# Patient Record
Sex: Male | Born: 1955 | Race: White | Hispanic: No | Marital: Married | Smoking: Never smoker
Health system: Southern US, Community
[De-identification: ages and names within clinical notes are randomized; demographics above are authoritative.]

## PROBLEM LIST (undated history)

## (undated) DIAGNOSIS — G459 Transient cerebral ischemic attack, unspecified: Secondary | ICD-10-CM

## (undated) DIAGNOSIS — E7800 Pure hypercholesterolemia, unspecified: Secondary | ICD-10-CM

## (undated) DIAGNOSIS — I10 Essential (primary) hypertension: Secondary | ICD-10-CM

## (undated) DIAGNOSIS — G4733 Obstructive sleep apnea (adult) (pediatric): Principal | ICD-10-CM

## (undated) DIAGNOSIS — E079 Disorder of thyroid, unspecified: Secondary | ICD-10-CM

## (undated) DIAGNOSIS — Z9989 Dependence on other enabling machines and devices: Principal | ICD-10-CM

## (undated) HISTORY — DX: Obstructive sleep apnea (adult) (pediatric): G47.33

## (undated) HISTORY — DX: Disorder of thyroid, unspecified: E07.9

## (undated) HISTORY — PX: OTHER SURGICAL HISTORY: SHX169

## (undated) HISTORY — PX: VASECTOMY: SHX75

## (undated) HISTORY — DX: Dependence on other enabling machines and devices: Z99.89

---

## 2009-10-23 ENCOUNTER — Encounter (INDEPENDENT_AMBULATORY_CARE_PROVIDER_SITE_OTHER): Payer: Self-pay | Admitting: Neurology

## 2009-10-23 ENCOUNTER — Observation Stay (HOSPITAL_COMMUNITY)
Admission: EM | Admit: 2009-10-23 | Discharge: 2009-10-23 | Payer: Self-pay | Source: Home / Self Care | Admitting: Emergency Medicine

## 2010-03-20 LAB — URINALYSIS, ROUTINE W REFLEX MICROSCOPIC
Hgb urine dipstick: NEGATIVE
Protein, ur: NEGATIVE mg/dL
Urobilinogen, UA: 0.2 mg/dL (ref 0.0–1.0)

## 2010-03-20 LAB — CBC
HCT: 48.1 % (ref 39.0–52.0)
Platelets: 208 10*3/uL (ref 150–400)
RBC: 5.7 MIL/uL (ref 4.22–5.81)
RDW: 13.2 % (ref 11.5–15.5)
WBC: 5.5 10*3/uL (ref 4.0–10.5)

## 2010-03-20 LAB — DIFFERENTIAL
Lymphocytes Relative: 33 % (ref 12–46)
Lymphs Abs: 1.8 10*3/uL (ref 0.7–4.0)
Monocytes Absolute: 0.5 10*3/uL (ref 0.1–1.0)
Monocytes Relative: 9 % (ref 3–12)
Neutro Abs: 2.9 10*3/uL (ref 1.7–7.7)

## 2010-03-20 LAB — COMPREHENSIVE METABOLIC PANEL
AST: 24 U/L (ref 0–37)
Albumin: 4.2 g/dL (ref 3.5–5.2)
BUN: 16 mg/dL (ref 6–23)
Creatinine, Ser: 1.26 mg/dL (ref 0.4–1.5)
GFR calc Af Amer: >60 mL/min (ref >60)
Total Protein: 7.6 g/dL (ref 6.0–8.3)

## 2010-03-20 LAB — CK TOTAL AND CKMB (NOT AT ARMC)
CK, MB: 3.4 ng/mL (ref 0.3–4.0)
Relative Index: 2 (ref 0.0–2.5)

## 2010-03-20 LAB — LIPID PANEL
Cholesterol: 245 mg/dL — ABNORMAL HIGH (ref 0–200)
HDL: 37 mg/dL — ABNORMAL LOW (ref >39)
Total CHOL/HDL Ratio: 6.6 RATIO
VLDL: 31 mg/dL (ref 0–40)

## 2010-03-20 LAB — PROTIME-INR: INR: 0.98 (ref 0.00–1.49)

## 2010-03-20 LAB — GLUCOSE, CAPILLARY: Glucose-Capillary: 121 mg/dL — ABNORMAL HIGH (ref 70–99)

## 2010-03-20 LAB — APTT: aPTT: 35 seconds (ref 24–37)

## 2010-03-20 LAB — TROPONIN I: Troponin I: <0.01 ng/mL (ref 0.00–0.06)

## 2012-10-11 ENCOUNTER — Emergency Department (HOSPITAL_COMMUNITY)
Admission: EM | Admit: 2012-10-11 | Discharge: 2012-10-11 | Disposition: A | Discharge status: Home / Self Care | Payer: PRIVATE HEALTH INSURANCE | Attending: Emergency Medicine | Admitting: Emergency Medicine

## 2012-10-11 ENCOUNTER — Emergency Department (HOSPITAL_COMMUNITY): Payer: PRIVATE HEALTH INSURANCE

## 2012-10-11 ENCOUNTER — Encounter (HOSPITAL_COMMUNITY): Payer: Self-pay | Admitting: Emergency Medicine

## 2012-10-11 DIAGNOSIS — Z7902 Long term (current) use of antithrombotics/antiplatelets: Secondary | ICD-10-CM | POA: Insufficient documentation

## 2012-10-11 DIAGNOSIS — Z9981 Dependence on supplemental oxygen: Secondary | ICD-10-CM | POA: Insufficient documentation

## 2012-10-11 DIAGNOSIS — Z79899 Other long term (current) drug therapy: Secondary | ICD-10-CM | POA: Insufficient documentation

## 2012-10-11 DIAGNOSIS — R0789 Other chest pain: Secondary | ICD-10-CM | POA: Insufficient documentation

## 2012-10-11 DIAGNOSIS — R61 Generalized hyperhidrosis: Secondary | ICD-10-CM | POA: Insufficient documentation

## 2012-10-11 DIAGNOSIS — Z7982 Long term (current) use of aspirin: Secondary | ICD-10-CM | POA: Insufficient documentation

## 2012-10-11 HISTORY — DX: Essential (primary) hypertension: I10

## 2012-10-11 HISTORY — DX: Pure hypercholesterolemia, unspecified: E78.00

## 2012-10-11 HISTORY — DX: Transient cerebral ischemic attack, unspecified: G45.9

## 2012-10-11 LAB — POCT I-STAT, CHEM 8
BUN: 21 mg/dL (ref 6–23)
Creatinine, Ser: 1.2 mg/dL (ref 0.50–1.35)
Glucose, Bld: 123 mg/dL — ABNORMAL HIGH (ref 70–99)
Hemoglobin: 16 g/dL (ref 13.0–17.0)
Sodium: 139 mEq/L (ref 135–145)
TCO2: 26 mmol/L (ref 0–100)

## 2012-10-11 LAB — CBC WITH DIFFERENTIAL/PLATELET
Eosinophils Absolute: 0.2 10*3/uL (ref 0.0–0.7)
Eosinophils Relative: 4 % (ref 0–5)
HCT: 44.7 % (ref 39.0–52.0)
Hemoglobin: 16 g/dL (ref 13.0–17.0)
Lymphocytes Relative: 28 % (ref 12–46)
Lymphs Abs: 1.4 10*3/uL (ref 0.7–4.0)
MCH: 30.1 pg (ref 26.0–34.0)
MCV: 84.2 fL (ref 78.0–100.0)
Monocytes Absolute: 0.5 10*3/uL (ref 0.1–1.0)
Monocytes Relative: 11 % (ref 3–12)
RBC: 5.31 MIL/uL (ref 4.22–5.81)
WBC: 5.1 10*3/uL (ref 4.0–10.5)

## 2012-10-11 LAB — POCT I-STAT TROPONIN I

## 2012-10-11 MED ORDER — NITROGLYCERIN 0.4 MG SL SUBL
0.4000 mg | SUBLINGUAL_TABLET | SUBLINGUAL | Status: DC | PRN
  Administered 2012-10-11 (×2): 0.4 mg via SUBLINGUAL
  Filled 2012-10-11: qty 25

## 2012-10-11 NOTE — ED Notes (Signed)
Pt states that he was having intense chest discomfort this AM when he woke. Pt states that the severity of the discomfort has lessened. Pt states that he has taken 81 mg of aspirin this AM.

## 2012-10-11 NOTE — ED Provider Notes (Signed)
CSN: [id number]     Arrival date & time 10/11/12  0436 History   First MD Initiated Contact with Patient 10/11/12 [phone]     Chief Complaint  Patient presents with  . Chest Pain   (Consider location/radiation/quality/duration/timing/severity/associated sxs/prior Treatment) HPI This is a 57 year old male who developed chest pain this morning about 3:30. The chest pain is located in his left pectoral region and is described as vague and difficult to characterize. It does not radiate. It was of mild to moderate intensity initially and is very mild at this point. There was no associated shortness of breath or nausea. He has been doing a lot of lifting recently. He did have some sweating but states that he usually sweats at night due to his CPAP machine. He is on aspirin and Plavix daily as a result of a TIA several years ago. He took an extra dose of aspirin this morning prior to arrival.  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  No past medical history on file. No past surgical history on file. No family history on file. History  Substance Use Topics  . Smoking status: Not on file  . Smokeless tobacco: Not on file  . Alcohol Use: Not on file    Review of Systems  All other systems reviewed and are negative.    Allergies  Review of patient's allergies indicates not on file.  Home Medications   Current Outpatient Rx  Name  Route  Sig  Dispense  Refill  . aspirin 81 MG tablet   Oral   Take 81 mg by mouth daily.         . Cholecalciferol (VITAMIN D) 2000 UNITS tablet   Oral   Take 2,000 Units by mouth daily.         . clopidogrel (PLAVIX) 75 MG tablet   Oral   Take 75 mg by mouth daily.         Marland Kitchen ezetimibe (ZETIA) 10 MG tablet   Oral   Take 10 mg by mouth daily.         . folic acid (FOLVITE) 800 MCG tablet   Oral   Take 800 mcg by mouth daily.         Marland Kitchen levothyroxine (SYNTHROID, LEVOTHROID) 125 MCG tablet  Oral   Take 125 mcg by mouth daily before breakfast.         . olmesartan (BENICAR) 20 MG tablet   Oral   Take 20 mg by mouth daily.         . rosuvastatin (CRESTOR) 10 MG tablet   Oral   Take 10 mg by mouth daily.          BP 158/99  Pulse 83  Temp(Src) 98.1 F (36.7 C) (Oral)  Resp 16  SpO2 99%  Physical Exam General: Well-developed, well-nourished male in no acute distress; appearance consistent with age of record HENT: normocephalic; atraumatic Eyes: pupils equal, round and reactive to light; extraocular muscles intact Neck: supple Heart: regular rate and rhythm; no murmurs, rubs or gallops Lungs: clear to auscultation bilaterally Chest: Left lateral chest wall tenderness which reproduces pain of a similar character to the pain of the chief complaint Abdomen: soft; nondistended; nontender; bowel sounds present Extremities: No deformity; full range of motion; pulses normal Neurologic: Awake, alert and oriented; motor function intact in all extremities and symmetric; no facial droop Skin: Warm and dry Psychiatric: Normal mood and affect  ED Course  Procedures (including critical care time)  MDM   Nursing notes and vitals signs, including pulse oximetry, reviewed.  Summary of this visit's results, reviewed by myself:  Labs:  Results for orders placed during the hospital encounter of 10/11/12 (from the past 24 hour(s))  CBC WITH DIFFERENTIAL     Status: None   Collection Time    10/11/12  4:54 AM      Result Value Range   WBC 5.1  4.0 - 10.5 K/uL   RBC 5.31  4.22 - 5.81 MIL/uL   Hemoglobin 16.0  13.0 - 17.0 g/dL   HCT 65.7  84.6 - 96.2 %   MCV 84.2  78.0 - 100.0 fL   MCH 30.1  26.0 - 34.0 pg   MCHC 35.8  30.0 - 36.0 g/dL   RDW 95.2  84.1 - 32.4 %   Platelets 156  150 - 400 K/uL   Neutrophils Relative % 57  43 - 77 %   Neutro Abs 2.9  1.7 - 7.7 K/uL   Lymphocytes Relative 28  12 - 46 %   Lymphs Abs 1.4  0.7 - 4.0 K/uL   Monocytes Relative 11  3 -  12 %   Monocytes Absolute 0.5  0.1 - 1.0 K/uL   Eosinophils Relative 4  0 - 5 %   Eosinophils Absolute 0.2  0.0 - 0.7 K/uL   Basophils Relative 0  0 - 1 %   Basophils Absolute 0.0  0.0 - 0.1 K/uL  POCT I-STAT TROPONIN I     Status: None   Collection Time    10/11/12  5:20 AM      Result Value Range   Troponin i, poc 0.00  0.00 - 0.08 ng/mL   Comment 3           POCT I-STAT, CHEM 8     Status: Abnormal   Collection Time    10/11/12  5:22 AM      Result Value Range   Sodium 139  135 - 145 mEq/L   Potassium 5.0  3.5 - 5.1 mEq/L   Chloride 104  96 - 112 mEq/L   BUN 21  6 - 23 mg/dL   Creatinine, Ser 4.01  0.50 - 1.35 mg/dL   Glucose, Bld 027 (*) 70 - 99 mg/dL   Calcium, Ion 2.53  1.12 - 1.23 mmol/L   TCO2 26  0 - 100 mmol/L   Hemoglobin 16.0  13.0 - 17.0 g/dL   HCT 66.4  40.3 - 47.4 %    Imaging Studies: Dg Chest Port 1 View  10/11/2012   *RADIOLOGY REPORT*  Clinical Data: Chest discomfort.  PORTABLE CHEST - 1 VIEW  Comparison: Chest radiograph performed 10/23/2009  Findings: . The lungs are well-aerated.  Vascular congestion is noted.  There is no evidence of focal opacification, pleural effusion or pneumothorax.  The heart is borderline normal in size; the mediastinal contour is within normal limits.  No acute osseous abnormalities are seen.  IMPRESSION: Vascular congestion noted; lungs remain grossly clear.   Original Report Authenticated By: Tonia Ghent, M.D.      EKG Interpretation:  Date & Time: 10/11/2012 4:42 AM  Rate: 85  Rhythm: normal sinus rhythm  QRS Axis: normal  Intervals: normal  ST/T Wave abnormalities: normal  Conduction Disutrbances:left anterior fascicular block  Narrative Interpretation:   Old EKG Reviewed: none available  6:33 AM There is no change in the patients pain with 2 sublingual nitroglycerin tablets.  His story is atypical for pain of coronary artery origin. 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 He states he  will return should the pain worsen or change. He will call his cardiologist and his PCP later today.    Hanley Seamen, MD 10/11/12 [phone]

## 2012-10-14 ENCOUNTER — Other Ambulatory Visit: Payer: Self-pay | Admitting: *Deleted

## 2012-10-14 DIAGNOSIS — R079 Chest pain, unspecified: Secondary | ICD-10-CM

## 2012-10-15 ENCOUNTER — Telehealth (HOSPITAL_COMMUNITY): Payer: Self-pay | Admitting: *Deleted

## 2012-10-15 ENCOUNTER — Encounter (HOSPITAL_COMMUNITY): Payer: Self-pay | Admitting: *Deleted

## 2012-10-28 ENCOUNTER — Ambulatory Visit (HOSPITAL_COMMUNITY)
Admission: RE | Admit: 2012-10-28 | Discharge: 2012-10-28 | Disposition: A | Discharge status: Home / Self Care | Payer: PRIVATE HEALTH INSURANCE | Source: Ambulatory Visit | Attending: Cardiology | Admitting: Cardiology

## 2012-10-28 DIAGNOSIS — R079 Chest pain, unspecified: Secondary | ICD-10-CM

## 2012-10-28 MED ORDER — TECHNETIUM TC 99M SESTAMIBI GENERIC - CARDIOLITE
10.0000 | Freq: Once | INTRAVENOUS | Status: AC | PRN
  Administered 2012-10-28: 10 via INTRAVENOUS

## 2012-10-28 MED ORDER — TECHNETIUM TC 99M SESTAMIBI GENERIC - CARDIOLITE
30.0000 | Freq: Once | INTRAVENOUS | Status: AC | PRN
  Administered 2012-10-28: 30 via INTRAVENOUS

## 2012-10-28 NOTE — Procedures (Addendum)
[facility] [address] [place] [address] [phone]  Cardiology Nuclear Med Study  Darren Hull is a 57 y.o. male     MRN : [id number]     DOB: 04/07/1955  Procedure Date: 10/28/2012  Nuclear Med Background Indication for Stress Test:  Evaluation for Ischemia, Post Hospital and Abnormal EKG History:  No prior history reported. Cardiac Risk Factors: Family History - CAD, Hypertension, Lipids, Overweight and TIA  Symptoms:  Chest Pain   Nuclear Pre-Procedure Caffeine/Decaff Intake:  7:00pm NPO After: 5:00am   IV Site: R Hand  IV 0.9% NS with Angio Cath:  22g  Chest Size (in):  44"  IV Started by: Emmit Pomfret, RN  Height: 5\' 9"  (1.753 m)  Cup Size: n/a  BMI:  Body mass index is 32.92 kg/(m^2). Weight:  223 lb (101.152 kg)   Tech Comments:  n/a    Nuclear Med Study 1 or 2 day study: 1 day  Stress Test Type:  Stress  Order Authorizing Provider:  [name], MD   Resting Radionuclide: Technetium 50m Sestamibi  Resting Radionuclide Dose: 10.4 mCi   Stress Radionuclide:  Technetium 27m Sestamibi  Stress Radionuclide Dose: 31.0 mCi           Stress Protocol Rest HR: 55 Stress HR: 151  Rest BP: 127/91 Stress BP: 189/74  Exercise Time (min): 12:01 METS: 13.40   Predicted Max HR: 165 bpm % Max HR: 91.52 bpm Rate Pressure Product: 46962  Dose of Adenosine (mg):  n/a Dose of Lexiscan: n/a mg  Dose of Atropine (mg): n/a Dose of Dobutamine: n/a mcg/kg/min (at max HR)  Stress Test Technologist: Ernestene Mention, CCT Nuclear Technologist: Koren Shiver, CNMT   Rest Procedure:  Myocardial perfusion imaging was performed at rest 45 minutes following the intravenous administration of Technetium 46m Sestamibi. Stress Procedure:  The patient performed treadmill exercise using a Bruce  Protocol for 12 minutes and 1 second. The patient stopped due to fatigue. Patient denied any chest pain.  There were no significant ST-T  wave changes.  Technetium 63m Sestamibi was injected at peak exercise and myocardial perfusion imaging was performed after a brief delay.  Transient Ischemic Dilatation (Normal <1.22):  0.88 Lung/Heart Ratio (Normal <0.45):  0.33 QGS EDV:  114 ml QGS ESV:  43 ml LV Ejection Fraction: 62%  Signed by        Rest ECG: Sinus Bradycardia at 55 bpm  Stress ECG: No significant change from baseline ECG  QPS Raw Data Images:  Normal; no motion artifact; normal heart/lung ratio. Stress Images:  Normal homogeneous uptake in all areas of the myocardium. Rest Images:  Mild inferior attenuation artifact; otherwise, normal homogeneous uptake in remaining areas of the myocardium. Subtraction (SDS):  Normal  Impression Exercise Capacity:  Excellent exercise capacity. BP Response:  Normal blood pressure response. Clinical Symptoms:  No symptoms. ECG Impression:  No significant ST segment change suggestive of ischemia. Comparison with Prior Nuclear Study: No previous nuclear study performed  Overall Impression:  Normal stress nuclear study.  LV Wall Motion:  NL LV Function, EF 62%; NL Wall Motion   [name] A, MD  10/28/2012 10:09 AM

## 2014-05-05 ENCOUNTER — Encounter: Payer: Self-pay | Admitting: Neurology

## 2014-05-05 ENCOUNTER — Ambulatory Visit (INDEPENDENT_AMBULATORY_CARE_PROVIDER_SITE_OTHER): Payer: PRIVATE HEALTH INSURANCE | Admitting: Neurology

## 2014-05-05 VITALS — BP 94/63 | HR 70 | Resp 16 | Ht 69.0 in | Wt 226.0 lb

## 2014-05-05 DIAGNOSIS — G4733 Obstructive sleep apnea (adult) (pediatric): Secondary | ICD-10-CM | POA: Diagnosis not present

## 2014-05-05 DIAGNOSIS — E669 Obesity, unspecified: Secondary | ICD-10-CM

## 2014-05-05 DIAGNOSIS — Z9989 Dependence on other enabling machines and devices: Principal | ICD-10-CM

## 2014-05-05 HISTORY — DX: Obstructive sleep apnea (adult) (pediatric): G47.33

## 2014-05-05 NOTE — Progress Notes (Signed)
SLEEP [facility]INE CLINIC   Provider:  Melvyn Novasarmen  Riven Beebe, M D  Referring Provider: Martha ClanShaw, William, MD Primary Care Physician:  Martha ClanShaw, William, MD  Chief Complaint  Patient presents with  . Follow-up    rm 10, alone, cpap follow up    HPI:  Darren Hull is a [age] y.o. male seen here as a referral  from Dr. Clelia CroftShaw for a d follow up on his sleep apnea condition, currently on CPAP 6 cm water.    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 promote CD on 10-24-09 in a diagnostic polysomnography his AHI at the time was 24.3 and the patient spent about 40% of the night in supine sleep position there was clearly also an aggravation of his apnea index by supine sleep in rem sleep his AHI was 61.5 he did not have a significant time in hypoxemia but his lowest oxygen saturation was recorded at 76% which is low. There were no PLM's and no irregular heartbeats. The patient was titrated to 6 cm water pressure and is currently using still the same setting he is 100% compliant over the last 30 days and every of these days he has used the machine for over 4 hours. Average user time total is 8 hours and 10 minutes. He uses no EPR setting but expiratory pressure his AHI is a remarkable 1.3. The patient currently has still or be gained back to the same weight that he had at the time he was originally tested 220 pounds or about. The patient was originally evaluated for possible TIA on 10-23-09 and an MRI of the brain and neck with special attention to the carotid arteries were performed. These were normal.  The patient reports that also he used to sleep prone or preferred in prone position he now sleeps on his side. He works as a Midwifeneurosurgeon.   He reports his 404 year old mother had a stroke last week, treated by Dr Pearlean BrownieSethi.     Review of Systems: Out of a complete 14 system review, the patient complains of only the following symptoms, and all other reviewed systems are  negative. Snoring before CPAP, weight gain.   Epworth score 0 , Fatigue severity score 11  , depression score n/a    History   Social History  . Marital Status: Unknown    Spouse Name: N/A  . Number of Children: N/A  . Years of Education: MD   Occupational History  . neurosurgeon    Social History Main Topics  . Smoking status: Never Smoker   . Smokeless tobacco: Not on file  . Alcohol Use: No     Comment: occasional glass of wine 2x weekly  . Drug Use: Not on file  . Sexual Activity: Not on file   Other Topics Concern  . Not on file   Social History Narrative   Drinks average of 1 cup coffee daily.    Family History  Problem Relation Age of Onset  . Stroke Mother   . Atrial fibrillation Mother   . Lymphoma Father   . Pulmonary embolism Brother   . Cerebral palsy Brother     Past Medical History  Diagnosis Date  . Hypertension   . Hypercholesteremia   . TIA (transient ischemic attack)   . Thyroid disease   . OSA on CPAP 05/05/2014    Past Surgical History  Procedure Laterality Date  . Adnoidectomy    . Vasectomy      Current  Outpatient Prescriptions  Medication Sig Dispense Refill  . aspirin 81 MG tablet Take 81 mg by mouth every morning.     . Cholecalciferol (VITAMIN D) 2000 UNITS tablet Take 2,000 Units by mouth every morning.     . clopidogrel (PLAVIX) 75 MG tablet Take 75 mg by mouth every morning.     . ezetimibe (ZETIA) 10 MG tablet Take 10 mg by mouth every morning.     . folic acid (FOLVITE) 800 MCG tablet Take 800 mcg by mouth every morning.     Marland Kitchen levothyroxine (SYNTHROID, LEVOTHROID) 137 MCG tablet Take 137 mcg by mouth daily before breakfast.    . olmesartan (BENICAR) 20 MG tablet Take 20 mg by mouth every morning.     . rosuvastatin (CRESTOR) 10 MG tablet Take 10 mg by mouth every morning.      No current facility-administered medications for this visit.    Allergies as of 05/05/2014 - Review Complete 05/05/2014  Allergen Reaction  Noted  . Hydrochlorothiazide Itching and Rash 10/11/2012    Vitals: BP 94/63 mmHg  Pulse 70  Resp 16  Ht  (1.753 m)  Wt 226 lb (102.513 kg)  BMI 33.36 kg/m2 Last Weight:  Wt Readings from Last 1 Encounters:  05/05/14 226 lb (102.513 kg)       Last Height:   Ht Readings from Last 1 Encounters:  05/05/14  (1.753 m)    Physical exam:  General: The patient is awake, alert and appears not in acute distress. The patient is well groomed. Head: Normocephalic, atraumatic. Neck is supple. Mallampati 4,  neck circumference:18.5 . Nasal airflow unrestricted , TMJ is  evident . Retrognathia is seen.  Cardiovascular:  Regular rate and rhythm , without  murmurs or carotid bruit, and without distended neck veins. Respiratory: Lungs are clear to auscultation. Skin:  Without evidence of edema, or rash Trunk: BMI is elevated and patient  has normal posture.  Neurologic exam : The patient is awake and alert, oriented to place and time.   Memory subjective  described as intact. There is a normal attention span & concentration ability. Speech is fluent without  dysarthria, dysphonia or aphasia. Mood and affect are appropriate.  Cranial nerves: Pupils are equal and briskly reactive to light. Funduscopic exam without evidence of pallor or edema.  Extraocular movements  in vertical and horizontal planes intact and without nystagmus. Visual fields by finger perimetry are intact. Hearing to finger rub intact.  Facial sensation intact to fine touch. Facial motor strength is symmetric and tongue and uvula move midline.  Motor exam:   Normal tone ,muscle bulk and symmetric ,strength in all extremities.  Sensory:  Fine touch, pinprick and vibration were tested in all extremities.  Proprioception is normal.  Coordination: Rapid alternating movements in the fingers/hands is normal. Finger-to-nose maneuver  normal without evidence of ataxia, dysmetria or tremor.  Gait and station: Patient walks  without assistive device and is able unassisted to climb up to the exam table.  Strength within normal limits. Stance is stable and normal.  Tandem gait is unfragmented. Romberg  negative.  Deep tendon reflexes: in the  upper and lower extremities are symmetric and intact.  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  I suggested that she use an alternate titrated for a duration of 30 days and set the machine between 4 and 10 cm water. I have wondered if he needs CPAP at this point at all since he seems to avoid supine sleep his main contributor to her high AHI. He likes his CPAP and he does not have an urge to switch from CPAP therapy. Should he really have mostly snoring at this time and almost no residual apnea I would really recommend to change him to a dental device if that is what he would like to do. Otherwise I will give him today prescription for the CPAP replacement parts that he will use and the leave it up to a discussion with [facility] at [facility] to see if his machine is outer titration compatible. As per [name] he is happy with the current treatment has gotten well adjusted to being a CPAP user.   The patient was advised of the nature of the diagnosed sleep disorder , the treatment options and risks for general a health and wellness arising from not treating the condition. Visit duration was 30 minutes.   Plan:  Treatment plan and additional workup : autotitration with [facility].      Porfirio Mylar Douglass Dunshee MD  05/05/2014

## 2014-06-28 ENCOUNTER — Ambulatory Visit: Payer: PRIVATE HEALTH INSURANCE | Admitting: Neurology

## 2014-08-02 ENCOUNTER — Ambulatory Visit (INDEPENDENT_AMBULATORY_CARE_PROVIDER_SITE_OTHER): Payer: PRIVATE HEALTH INSURANCE | Admitting: Neurology

## 2014-08-02 ENCOUNTER — Encounter: Payer: Self-pay | Admitting: Neurology

## 2014-08-02 VITALS — BP 130/76 | HR 76 | Resp 20 | Ht 69.0 in | Wt 229.0 lb

## 2014-08-02 DIAGNOSIS — E669 Obesity, unspecified: Secondary | ICD-10-CM

## 2014-08-02 DIAGNOSIS — Z9989 Dependence on other enabling machines and devices: Principal | ICD-10-CM

## 2014-08-02 DIAGNOSIS — G4733 Obstructive sleep apnea (adult) (pediatric): Secondary | ICD-10-CM

## 2014-08-02 NOTE — Progress Notes (Signed)
SLEEP MEDICINE CLINIC   Provider:  Melvyn Novas, M D  Referring Provider: Martha Clan, MD Primary Care Physician:  Martha Clan, MD  Chief Complaint  Patient presents with  . Follow-up    cpap, rm 11, alone    HPI:  Darren Hull is a 59 y.o. male seen here as a referral  from [name] for a d follow up on his sleep apnea condition, currently on CPAP 6 cm water.    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 promote CD on 10-24-09 in a diagnostic polysomnography his AHI at the time was 24.3 and the patient spent about 40% of the night in supine sleep position there was clearly also an aggravation of his apnea index by supine sleep in rem sleep his AHI was 61.5 he did not have a significant time in hypoxemia but his lowest oxygen saturation was recorded at 76% which is low. There were no PLM's and no irregular heartbeats. The patient was titrated to 6 cm water pressure and is currently using still the same setting he is 100% compliant over the last 30 days and every of these days he has used the machine for over 4 hours. Average user time total is 8 hours and 10 minutes. He uses no EPR setting but expiratory pressure his AHI is a remarkable 1.3. The patient currently has still or be gained back to the same weight that he had at the time he was originally tested 220 pounds or about. The patient was originally evaluated for possible TIA on 10-23-09 and an MRI of the brain and neck with special attention to the carotid arteries were performed. These were normal.  The patient reports that also he used to sleep prone or preferred in prone position he now sleeps on his side. He works as a Midwife. He reports his 85 year old mother had a stroke last week, treated by Dr [name].   Interval history from 7-20 7-16 Dr. Effie Shy is here today with a download for the last 30 days of CPAP use. He had been placed on an O2 set between 4 and 10 cm water pressure using  an inspiratory pressure relief of 2 cm water. The residual AHI is excellent at 1.0 the 95th percentile pressure of 9.3 cm water suggest that there is still some apneas been treated one of the questions the patient had was if he needs to continue using CPAP this download suggests that there may be still and frequency of obstructive apnea but requires a pressure of 9.3 cm water indicating that the AHI at baseline is probably 10 or more. To have an exact estimate I will ask him to undergo a home sleep test for one night without CPAP my goal is to establish if the patient has prolonged hypoxemia, if there is an AHI up off or below 10 and if he is CPAP could be replaced with a dental device. The goal would be to use a mandibular advancement therapy. [name] while using CPAP endorsed the fatigue severity score at 9 and the Epworth's sleepiness score at 0 points indicating no impairment of alertness, cognition, and no increased level of fatigue ability.     Review of Systems: Out of a complete 14 system review, the patient complains of only the following symptoms, and all other reviewed systems are negative. Snoring before CPAP, weight gain.   Epworth score 0 , Fatigue severity score 11  , depression score n/a  History   Social History  . Marital Status: Unknown    Spouse Name: N/A  . Number of Children: N/A  . Years of Education: MD   Occupational History  . neurosurgeon    Social History Main Topics  . Smoking status: Never Smoker   . Smokeless tobacco: Not on file  . Alcohol Use: No     Comment: occasional glass of wine 2x weekly  . Drug Use: Not on file  . Sexual Activity: Not on file   Other Topics Concern  . Not on file   Social History Narrative   Drinks average of 1 cup coffee daily.    Family History  Problem Relation Age of Onset  . Stroke Mother   . Atrial fibrillation Mother   . Lymphoma Father   . Pulmonary embolism Brother   . Cerebral palsy Brother      Past Medical History  Diagnosis Date  . Hypertension   . Hypercholesteremia   . TIA (transient ischemic attack)   . Thyroid disease   . OSA on CPAP 05/05/2014    Past Surgical History  Procedure Laterality Date  . Adnoidectomy    . Vasectomy      Current Outpatient Prescriptions  Medication Sig Dispense Refill  . aspirin 81 MG tablet Take 81 mg by mouth every morning.     . Cholecalciferol (VITAMIN D) 2000 UNITS tablet Take 2,000 Units by mouth every morning.     . clopidogrel (PLAVIX) 75 MG tablet Take 75 mg by mouth every morning.     . ezetimibe (ZETIA) 10 MG tablet Take 10 mg by mouth every morning.     . folic acid (FOLVITE) 800 MCG tablet Take 800 mcg by mouth every morning.     Marland Kitchen levothyroxine (SYNTHROID, LEVOTHROID) 137 MCG tablet Take 137 mcg by mouth daily before breakfast.    . olmesartan (BENICAR) 20 MG tablet Take 20 mg by mouth every morning.     . rosuvastatin (CRESTOR) 10 MG tablet Take 10 mg by mouth every morning.      No current facility-administered medications for this visit.    Allergies as of 08/02/2014 - Review Complete 08/02/2014  Allergen Reaction Noted  . Hydrochlorothiazide Itching and Rash 10/11/2012    Vitals: BP 130/76 mmHg  Pulse 76  Resp 20  Ht  (1.753 m)  Wt 229 lb (103.874 kg)  BMI 33.80 kg/m2 Last Weight:  Wt Readings from Last 1 Encounters:  08/02/14 229 lb (103.874 kg)       Last Height:   Ht Readings from Last 1 Encounters:  08/02/14  (1.753 m)    Physical exam:  General: The patient is awake, alert and appears not in acute distress. The patient is well groomed. Head: Normocephalic, atraumatic. Neck is supple. Mallampati 4,  neck circumference:18.5 . Nasal airflow unrestricted , TMJ is  evident . Retrognathia is seen.  Cardiovascular:  Regular rate and rhythm , without  murmurs or carotid bruit, and without distended neck veins. Respiratory: Lungs are clear to auscultation. Skin:  Without evidence of edema,  or rash Trunk: BMI is elevated and patient  has normal posture.  Neurologic exam : The patient is awake and alert, oriented to place and time.   Memory subjective  described as intact. There is a normal attention span & concentration ability. Speech is fluent without  dysarthria, dysphonia or aphasia. Mood and affect are appropriate.  Cranial nerves: Pupils are equal and briskly reactive to light. Funduscopic  exam without evidence of pallor or edema.  Extraocular movements  in vertical and horizontal planes intact and without nystagmus. Visual fields by finger perimetry are intact. Hearing to finger rub intact.  Facial sensation intact to fine touch. Facial motor strength is symmetric and tongue and uvula move midline.  Motor exam:   Normal tone ,muscle bulk and symmetric ,strength in all extremities.  Sensory:  Fine touch, pinprick and vibration were tested in all extremities.  Proprioception is normal.  Coordination: Rapid alternating movements in the fingers/hands is normal. Finger-to-nose maneuver  normal without evidence of ataxia, dysmetria or tremor.  Gait and station: Patient walks without assistive device and is able unassisted to climb up to the exam table.  Strength within normal limits. Stance is stable and normal.  Tandem gait is unfragmented. Romberg  negative.  Deep tendon reflexes: in the  upper and lower extremities are symmetric and intact.  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 I suggested that she use an alternate titrated for a duration of 30 days and set the machine between 4 and 10 cm water. I have wondered if he needs CPAP at this point at all since he seems to avoid supine sleep his  main contributor to her high AHI. He likes his CPAP and he does not have an urge to switch from CPAP therapy.    The patient was advised of the nature of the diagnosed sleep disorder ,  the treatment options and risks for general a health and wellness arising from not treating the condition. Visit duration was 15 minutes.   Plan:  Treatment plan and additional workup : HST.      Porfirio Mylar Ellyanna Holton MD  08/02/2014

## 2014-08-14 ENCOUNTER — Telehealth: Payer: Self-pay

## 2014-08-14 NOTE — Telephone Encounter (Signed)
Spoke with pt regarding his sleep study results. Advised him that his HST revealed osa and cpap therapy is still indicated. Pt verbalized understanding. Pt has a question for Dr. Vickey Huger (he did not want to discuss with me). He is requesting that Dr. Vickey Huger call him this afternoon, because he is in the OR this morning.

## 2014-08-16 NOTE — Telephone Encounter (Signed)
I left a voicemail on Tuesday morning and on Wednesday evening for the patient. Unfortunately he still has significant apnea and CPAP would be the preferred treatment option. Other therapies can be discussed when I can reach them in person or on the phone. C.Tashawn Laswell M.D.

## 2014-08-30 ENCOUNTER — Other Ambulatory Visit: Payer: Self-pay

## 2014-08-30 ENCOUNTER — Telehealth: Payer: Self-pay | Admitting: Neurology

## 2014-08-30 NOTE — Telephone Encounter (Signed)
Spoke to Dr. Vickey Huger, she said it was ordered in error.

## 2014-08-30 NOTE — Progress Notes (Signed)
Spoke to Dr. Vickey Huger about HST since pt was questioning why he needed it. Dr. Vickey Huger said to cancel it.

## 2014-08-30 NOTE — Telephone Encounter (Signed)
There is an order in the workqueue for a HST and when I called this patient to schedule it he states he has already done a study at home and doesn't think Dr. Vickey Huger wants him to do anything else.

## 2014-08-30 NOTE — Telephone Encounter (Signed)
Called pt and explained to him that the HST was cancelled and it was ordered in error. Pt verbalized understanding.

## 2014-09-04 ENCOUNTER — Other Ambulatory Visit: Payer: Self-pay

## 2014-09-04 DIAGNOSIS — Z9989 Dependence on other enabling machines and devices: Principal | ICD-10-CM

## 2014-09-04 DIAGNOSIS — G4733 Obstructive sleep apnea (adult) (pediatric): Secondary | ICD-10-CM

## 2014-09-04 NOTE — Progress Notes (Signed)
Dr. Vickey Huger said she spoke to pt and needs to order an auto titration study on pt with 5-12 cm H2O for two weeks. Order placed.

## 2014-09-04 NOTE — Progress Notes (Signed)
Dr. Vickey Huger asked me to cancel the auto-titration order for this pt. He has an autoset cpap. At this time, pt's AHI is 1.2 and uses his cpap at least 8 hours a night. Dr. Vickey Huger does not recommend any changes at this time.

## 2017-07-17 ENCOUNTER — Telehealth: Payer: Self-pay | Admitting: Cardiovascular Disease

## 2017-07-17 DIAGNOSIS — I517 Cardiomegaly: Secondary | ICD-10-CM

## 2017-07-17 DIAGNOSIS — I10 Essential (primary) hypertension: Secondary | ICD-10-CM

## 2017-07-17 NOTE — Telephone Encounter (Signed)
New message    Patient is waiting on a call back from [place],

## 2017-07-17 NOTE — Telephone Encounter (Signed)
Echo scheduled 7/19.  Dr. Tresa EndoKelly made aware

## 2017-07-17 NOTE — Telephone Encounter (Signed)
Spoke to Dr. Newell CoralNudelman, echo ordered per Dr. Tresa EndoKelly.  Scheduler aware to call and schedule.

## 2017-07-24 ENCOUNTER — Other Ambulatory Visit: Payer: Self-pay

## 2017-07-24 ENCOUNTER — Ambulatory Visit (HOSPITAL_COMMUNITY): Payer: PRIVATE HEALTH INSURANCE | Attending: Cardiology

## 2017-07-24 DIAGNOSIS — Z6833 Body mass index (BMI) 33.0-33.9, adult: Secondary | ICD-10-CM | POA: Diagnosis not present

## 2017-07-24 DIAGNOSIS — E669 Obesity, unspecified: Secondary | ICD-10-CM | POA: Insufficient documentation

## 2017-07-24 DIAGNOSIS — I517 Cardiomegaly: Secondary | ICD-10-CM | POA: Diagnosis present

## 2017-07-24 DIAGNOSIS — I10 Essential (primary) hypertension: Secondary | ICD-10-CM

## 2017-07-24 DIAGNOSIS — E785 Hyperlipidemia, unspecified: Secondary | ICD-10-CM | POA: Diagnosis not present

## 2017-08-11 ENCOUNTER — Encounter: Payer: Self-pay | Admitting: *Deleted

## 2019-07-14 DIAGNOSIS — Z20822 Contact with and (suspected) exposure to covid-19: Secondary | ICD-10-CM | POA: Diagnosis not present

## 2019-07-14 DIAGNOSIS — Z03818 Encounter for observation for suspected exposure to other biological agents ruled out: Secondary | ICD-10-CM | POA: Diagnosis not present

## 2019-10-14 DIAGNOSIS — H2513 Age-related nuclear cataract, bilateral: Secondary | ICD-10-CM | POA: Diagnosis not present

## 2019-10-14 DIAGNOSIS — H524 Presbyopia: Secondary | ICD-10-CM | POA: Diagnosis not present

## 2019-10-14 DIAGNOSIS — H40013 Open angle with borderline findings, low risk, bilateral: Secondary | ICD-10-CM | POA: Diagnosis not present

## 2019-10-14 DIAGNOSIS — D23122 Other benign neoplasm of skin of left lower eyelid, including canthus: Secondary | ICD-10-CM | POA: Diagnosis not present

## 2019-10-18 DIAGNOSIS — G4733 Obstructive sleep apnea (adult) (pediatric): Secondary | ICD-10-CM | POA: Diagnosis not present

## 2020-01-27 DIAGNOSIS — G4733 Obstructive sleep apnea (adult) (pediatric): Secondary | ICD-10-CM | POA: Diagnosis not present

## 2020-01-30 DIAGNOSIS — G4733 Obstructive sleep apnea (adult) (pediatric): Secondary | ICD-10-CM | POA: Diagnosis not present

## 2020-04-18 ENCOUNTER — Other Ambulatory Visit (HOSPITAL_COMMUNITY): Payer: Self-pay | Admitting: *Deleted

## 2020-04-20 ENCOUNTER — Ambulatory Visit (HOSPITAL_BASED_OUTPATIENT_CLINIC_OR_DEPARTMENT_OTHER)
Admission: RE | Admit: 2020-04-20 | Discharge: 2020-04-20 | Disposition: A | Discharge status: Home / Self Care | Payer: Self-pay | Source: Ambulatory Visit | Attending: Cardiology | Admitting: Cardiology

## 2020-04-20 ENCOUNTER — Other Ambulatory Visit: Payer: Self-pay

## 2020-05-11 DIAGNOSIS — G4733 Obstructive sleep apnea (adult) (pediatric): Secondary | ICD-10-CM | POA: Diagnosis not present

## 2020-05-16 DIAGNOSIS — E559 Vitamin D deficiency, unspecified: Secondary | ICD-10-CM | POA: Diagnosis not present

## 2020-05-16 DIAGNOSIS — Z125 Encounter for screening for malignant neoplasm of prostate: Secondary | ICD-10-CM | POA: Diagnosis not present

## 2020-05-16 DIAGNOSIS — E291 Testicular hypofunction: Secondary | ICD-10-CM | POA: Diagnosis not present

## 2020-05-16 DIAGNOSIS — R7301 Impaired fasting glucose: Secondary | ICD-10-CM | POA: Diagnosis not present

## 2020-05-16 DIAGNOSIS — E785 Hyperlipidemia, unspecified: Secondary | ICD-10-CM | POA: Diagnosis not present

## 2020-05-16 DIAGNOSIS — E039 Hypothyroidism, unspecified: Secondary | ICD-10-CM | POA: Diagnosis not present

## 2020-05-28 DIAGNOSIS — R82998 Other abnormal findings in urine: Secondary | ICD-10-CM | POA: Diagnosis not present

## 2020-05-28 DIAGNOSIS — Z1331 Encounter for screening for depression: Secondary | ICD-10-CM | POA: Diagnosis not present

## 2020-05-28 DIAGNOSIS — Z1212 Encounter for screening for malignant neoplasm of rectum: Secondary | ICD-10-CM | POA: Diagnosis not present

## 2020-05-28 DIAGNOSIS — Z0000 Encounter for general adult medical examination without abnormal findings: Secondary | ICD-10-CM | POA: Diagnosis not present

## 2020-05-28 DIAGNOSIS — I10 Essential (primary) hypertension: Secondary | ICD-10-CM | POA: Diagnosis not present

## 2020-07-12 DIAGNOSIS — S92514A Nondisplaced fracture of proximal phalanx of right lesser toe(s), initial encounter for closed fracture: Secondary | ICD-10-CM | POA: Diagnosis not present

## 2020-07-30 DIAGNOSIS — S92514D Nondisplaced fracture of proximal phalanx of right lesser toe(s), subsequent encounter for fracture with routine healing: Secondary | ICD-10-CM | POA: Diagnosis not present

## 2020-09-06 DIAGNOSIS — E785 Hyperlipidemia, unspecified: Secondary | ICD-10-CM | POA: Diagnosis not present

## 2020-09-06 DIAGNOSIS — E039 Hypothyroidism, unspecified: Secondary | ICD-10-CM | POA: Diagnosis not present

## 2020-09-06 DIAGNOSIS — S92514D Nondisplaced fracture of proximal phalanx of right lesser toe(s), subsequent encounter for fracture with routine healing: Secondary | ICD-10-CM | POA: Diagnosis not present

## 2020-11-14 DIAGNOSIS — D23122 Other benign neoplasm of skin of left lower eyelid, including canthus: Secondary | ICD-10-CM | POA: Diagnosis not present

## 2020-11-14 DIAGNOSIS — H40013 Open angle with borderline findings, low risk, bilateral: Secondary | ICD-10-CM | POA: Diagnosis not present

## 2020-11-14 DIAGNOSIS — H2513 Age-related nuclear cataract, bilateral: Secondary | ICD-10-CM | POA: Diagnosis not present

## 2020-11-14 DIAGNOSIS — H5203 Hypermetropia, bilateral: Secondary | ICD-10-CM | POA: Diagnosis not present

## 2021-05-02 DIAGNOSIS — L821 Other seborrheic keratosis: Secondary | ICD-10-CM | POA: Diagnosis not present

## 2021-05-02 DIAGNOSIS — L570 Actinic keratosis: Secondary | ICD-10-CM | POA: Diagnosis not present

## 2021-05-03 DIAGNOSIS — S92514D Nondisplaced fracture of proximal phalanx of right lesser toe(s), subsequent encounter for fracture with routine healing: Secondary | ICD-10-CM | POA: Diagnosis not present

## 2021-05-30 DIAGNOSIS — E291 Testicular hypofunction: Secondary | ICD-10-CM | POA: Diagnosis not present

## 2021-05-31 DIAGNOSIS — E039 Hypothyroidism, unspecified: Secondary | ICD-10-CM | POA: Diagnosis not present

## 2021-05-31 DIAGNOSIS — E559 Vitamin D deficiency, unspecified: Secondary | ICD-10-CM | POA: Diagnosis not present

## 2021-05-31 DIAGNOSIS — R7301 Impaired fasting glucose: Secondary | ICD-10-CM | POA: Diagnosis not present

## 2021-05-31 DIAGNOSIS — Z125 Encounter for screening for malignant neoplasm of prostate: Secondary | ICD-10-CM | POA: Diagnosis not present

## 2021-05-31 DIAGNOSIS — E785 Hyperlipidemia, unspecified: Secondary | ICD-10-CM | POA: Diagnosis not present

## 2021-06-05 DIAGNOSIS — Z1339 Encounter for screening examination for other mental health and behavioral disorders: Secondary | ICD-10-CM | POA: Diagnosis not present

## 2021-06-05 DIAGNOSIS — I2510 Atherosclerotic heart disease of native coronary artery without angina pectoris: Secondary | ICD-10-CM | POA: Diagnosis not present

## 2021-06-05 DIAGNOSIS — Z1331 Encounter for screening for depression: Secondary | ICD-10-CM | POA: Diagnosis not present

## 2021-06-05 DIAGNOSIS — R82998 Other abnormal findings in urine: Secondary | ICD-10-CM | POA: Diagnosis not present

## 2021-06-05 DIAGNOSIS — Z0000 Encounter for general adult medical examination without abnormal findings: Secondary | ICD-10-CM | POA: Diagnosis not present

## 2021-09-12 DIAGNOSIS — L570 Actinic keratosis: Secondary | ICD-10-CM | POA: Diagnosis not present

## 2021-09-12 DIAGNOSIS — L918 Other hypertrophic disorders of the skin: Secondary | ICD-10-CM | POA: Diagnosis not present

## 2021-09-12 DIAGNOSIS — D225 Melanocytic nevi of trunk: Secondary | ICD-10-CM | POA: Diagnosis not present

## 2021-09-12 DIAGNOSIS — L821 Other seborrheic keratosis: Secondary | ICD-10-CM | POA: Diagnosis not present

## 2021-10-14 ENCOUNTER — Ambulatory Visit: Payer: BC Managed Care – PPO | Attending: Cardiovascular Disease | Admitting: Cardiovascular Disease

## 2021-10-14 ENCOUNTER — Encounter: Payer: Self-pay | Admitting: Cardiovascular Disease

## 2021-10-14 VITALS — BP 118/74 | HR 61 | Wt 217.0 lb

## 2021-10-14 DIAGNOSIS — I5189 Other ill-defined heart diseases: Secondary | ICD-10-CM | POA: Diagnosis not present

## 2021-10-14 DIAGNOSIS — R0609 Other forms of dyspnea: Secondary | ICD-10-CM | POA: Diagnosis not present

## 2021-10-14 DIAGNOSIS — R931 Abnormal findings on diagnostic imaging of heart and coronary circulation: Secondary | ICD-10-CM | POA: Diagnosis not present

## 2021-10-14 DIAGNOSIS — E039 Hypothyroidism, unspecified: Secondary | ICD-10-CM

## 2021-10-14 DIAGNOSIS — I77819 Aortic ectasia, unspecified site: Secondary | ICD-10-CM | POA: Diagnosis not present

## 2021-10-14 DIAGNOSIS — G4733 Obstructive sleep apnea (adult) (pediatric): Secondary | ICD-10-CM

## 2021-10-14 DIAGNOSIS — E669 Obesity, unspecified: Secondary | ICD-10-CM

## 2021-10-14 MED ORDER — ROSUVASTATIN CALCIUM 40 MG PO TABS: 40.0000 mg | ORAL_TABLET | Freq: Every morning | ORAL | 3 refills | Status: DC

## 2021-10-14 MED ORDER — METOPROLOL TARTRATE 50 MG PO TABS: ORAL_TABLET | ORAL | 0 refills | Status: DC

## 2021-10-14 NOTE — Patient Instructions (Signed)
Medication Instructions:  INCREASE rosuvastatin (Crestor) 40 mg daily  *If you need a refill on your cardiac medications before your next appointment, please call your pharmacy*   Lab Work: Please return for FASTING labs in January (CMET, Lipid, LP(a), CRP)  Our in office lab hours are Monday-Friday 8:00-4:00, closed for lunch 12:45-1:45 pm.  No appointment needed.  [facility] locations:   [facility] - [address]  - [address] ([facility]) - [address] [id number]   [place] - [address]    [place]  - [address]    [place] - [address] - [address] [employer]  - [address] [place] - [address] Chief Technology Officer)  Testing/Procedures: Your physician has requested that you have an echocardiogram. Echocardiography is a painless test that uses sound waves to create images of your heart. It provides your doctor with information about the size and shape of your heart and how well your heart's chambers and valves are working. This procedure takes approximately one hour. There are no restrictions for this procedure.  Coronary CTA- see instructions below  Follow-Up: At [facility], you and your health needs are our priority.  As part of our continuing mission to provide you with exceptional heart care, we have created designated Provider Care Teams.  These Care Teams include your primary Cardiologist (physician) and Advanced Practice Providers (APPs -  Physician Assistants and Nurse Practitioners) who all work together to provide you with the care you need, when you need it.  We recommend signing up for the patient portal called "MyChart".  Sign up information is provided on this After Visit Summary.  MyChart is used to connect with patients for Virtual Visits (Telemedicine).  Patients are able to view lab/test results,  encounter notes, upcoming appointments, etc.  Non-urgent messages can be sent to your provider as well.   To learn more about what you can do with MyChart, go to [email or web address].    Your next appointment:   January with Dr. Tresa Endo  Other Instructions   Your cardiac CT will be scheduled at one of the below locations:   [facility] [address] [place], [place] [address] [phone]   If scheduled at [facility], please arrive at the [facility] and Children's Entrance (Entrance C2) of [facility] 30 minutes prior to test start time. You can use the FREE valet parking offered at entrance C (encouraged to control the heart rate for the test)  Proceed to the [facility] Radiology Department (first floor) to check-in and test prep.  All radiology patients and guests should use entrance C2 at [facility], accessed from [facility], even though the hospital's physical address listed is [address].     Please follow these instructions carefully (unless otherwise directed):  Hold all erectile dysfunction medications at least 3 days (72 hrs) prior to test. (Ie viagra, cialis, sildenafil, tadalafil, etc) We will administer nitroglycerin during this exam.   On the Night Before the Test: Be sure to Drink plenty of water. Do not consume any caffeinated/decaffeinated beverages or chocolate 12 hours prior to your test. Do not take any antihistamines 12 hours prior to your test.  On the Day of the Test: Drink plenty of water until 1 hour prior to the test. Do not  eat any food 1 hour prior to test. You may take your regular medications prior to the test.  Take metoprolol (Lopressor) two hours prior to test.  After the Test: Drink plenty of water. After receiving IV contrast, you may experience a mild flushed feeling. This is normal. On occasion, you may experience a mild rash up to 24 hours after the test. This is not  dangerous. If this occurs, you can take Benadryl 25 mg and increase your fluid intake. If you experience trouble breathing, this can be serious. If it is severe call 911 IMMEDIATELY. If it is mild, please call our office. If you take any of these medications: Glipizide/Metformin, Avandament, Glucavance, please do not take 48 hours after completing test unless otherwise instructed.  We will call to schedule your test 2-4 weeks out understanding that some insurance companies will need an authorization prior to the service being performed.   For non-scheduling related questions, please contact the cardiac imaging nurse navigator should you have any questions/concerns: Marchia Bond, Cardiac Imaging Nurse Navigator Gordy Clement, Cardiac Imaging Nurse Navigator [facility] Direct Office Dial: [phone]   For scheduling needs, including cancellations and rescheduling, please call [place], [phone].

## 2021-10-14 NOTE — Progress Notes (Signed)
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  In 2011, he developed new onset abnormal sensation in his right face, arm and leg and presented to [facility].  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  An MRI showed ventricular size to be normal and there was a 7 to 11 mm benign cyst in the right basal ganglier.  Cerebral tonsils and foramen magnum were within normal limits.  He was evaluated by Darren Hull and was felt possibly at that time to have suffered a left brainstem or subcortical transient ischemic activity, but ultimately according to Darren Hull this was subsequently not felt to be the case.  At the time, he was started on Plavix as well as lipid-lowering therapy with Crestor after his lipid panel showed total cholesterol 245, triglycerides 157, HDL 37, LDL 177.  Patient was subsequently evaluated by me but I do not have records of my office note from the [facility].  He had undergone an echo Doppler study at that time and also underwent advanced lipid testing with [facility] heart lab on October 31, 2009.  ApoB was 103.  C-reactive protein was intermediary at 2.5 mg/L.   He was subsequently referred to Darren Hull and has been on CPAP therapy for obstructive sleep apnea.  In 2014, he was underwent a nuclear stress test in which he completed stage IV the Bruce protocol without ECG changes and had normal myocardial perfusion with EF at 62% and normal wall motion.  In 2019  he apparently underwent a 2D echo Doppler study on July 24, 2017 which showed normal systolic function with EF 55 to 60%.  There was moderate focal basal hypertrophy.  Wall motion was normal.  There was grade 2 diastolic dysfunction.  He was noted to have mild dilatation of ascending aorta at 40 mm.  There was mild left atrial dilatation.  There was no significant valvular pathology.  As part of [facility] laboratory he underwent a CT cardiac calcium score on April 20, 2020 which revealed an elevated calcium score at 1184 Agaston units: LAD calcification was 367, left circumflex 125, and RCA 693 agonist on units.  He has been followed by Darren Hull at [facility] for his primary care.  Most recently, he has been on rosuvastatin 20 mg and Zetia 10 mg for hyperlipidemia, vitamin D 2000 units daily, AndroGel transdermally, 81 mg aspirin, clopidogrel 75 mg, olmesartan 20 mg, and has a prescription for sildenafil to take as needed.  He recently saw Darren Hull and had noticed in April/May 2023 while playing pickle ball with increased heart rate he developed some trouble catching his breath.  This occurred when he was playing on a hot afternoon.  Since his retirement 2 years ago, exercises regularly and typically may walk 7-1/2 miles per day.  In addition he has been doing Pilates 2 times per week.  He has noticed occasional shortness of breath while doing Pilates but denies any associated chest pain.  Laboratory in  May 2022 on rosuvastatin 10 mg total cholesterol was 151, triglycerides 70, HDL 51 and LDL 84.  Rosuvastatin dose was increased to 20 mg and subsequent laboratory in September 2022 showed total cholesterol 124, triglycerides 84, HDL 46 and LDL 61.  Most recent laboratory in May 2023 showed total cholesterol 124, triglycerides 79, HDL 36 and LDL 72.  With his recent awareness of some exertional shortness of breath while playing pickle ball and Pilates he now presents to reestablish cardiology care after I had not seen him in the office in many years.   Past  Medical History:  Diagnosis Date   Hypercholesteremia    Hypertension    OSA on CPAP 05/05/2014   Thyroid disease    TIA (transient ischemic attack)     Past Surgical History:  Procedure Laterality Date   adnoidectomy     VASECTOMY      Current Medications: Outpatient Medications Prior to Visit  Medication Sig Dispense Refill   aspirin 81 MG tablet Take 81 mg by mouth every morning.      Cholecalciferol (VITAMIN D) 2000 UNITS tablet Take 2,000 Units by mouth every morning.      clopidogrel (PLAVIX) 75 MG tablet Take 75 mg by mouth every morning.      ezetimibe (ZETIA) 10 MG tablet Take 10 mg by mouth every morning.      folic acid (FOLVITE) 800 MCG tablet Take 800 mcg by mouth every morning.      levothyroxine (SYNTHROID, LEVOTHROID) 137 MCG tablet Take 137 mcg by mouth daily before breakfast.     olmesartan (BENICAR) 20 MG tablet Take 20 mg by mouth every morning.      sildenafil (VIAGRA) 100 MG tablet TAKE 1 TABLET EVERY DAY AS NEEDED Oral     Testosterone (ANDROGEL) 20.25 MG/1.25GM (1.62%) GEL Apply 2 pumps daily as directed Transdermal     zaleplon (SONATA) 10 MG capsule Take 10 mg by mouth at bedtime as needed.     rosuvastatin (CRESTOR) 10 MG tablet Take 20 mg by mouth every morning.     No facility-administered medications prior to visit.     Allergies:   Hydrochlorothiazide   Social History   Socioeconomic History   Marital status: Married    Spouse name: Not on file   Number of children: Not on file   Years of education: MD   Highest education level: Not on file  Occupational History   Occupation: neurosurgeon  Tobacco Use   Smoking status: Never   Smokeless tobacco: Not on file  Substance and Sexual Activity   Alcohol use: No    Alcohol/week: 0.0 standard drinks of alcohol    Comment: occasional glass of wine 2x weekly   Drug use: Not on file   Sexual activity: Not on file  Other Topics Concern   Not on file  Social History Narrative   Drinks average  of 1 cup coffee daily.   Social Determinants of Health   Financial Resource Strain: Not on file  Food Insecurity: Not on file  Transportation Needs: Not on file  Physical Activity: Not on file  Stress: Not on file  Social Connections: Not on file    Socially he attended [employer] and was in the 6-year college/medical school program.  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  He exercises regularly and walks at least 7/2 miles per day.  He is married to Darren Hull and both his daughters are Engineer, manufacturing systems.  There is  no tobacco use.  Family History:  The patient's family history includes Atrial fibrillation in his mother; Cerebral palsy in his brother; Lymphoma in his father; Pulmonary embolism in his brother; Stroke in his mother.   ROS General: Negative; No fevers, chills, or night sweats;  HEENT: Negative; No changes in vision or hearing, sinus congestion, difficulty swallowing Pulmonary: Negative; No cough, wheezing, shortness of breath, hemoptysis Cardiovascular: Negative; No chest pain, presyncope, syncope, palpitations GI: Negative; No nausea, vomiting, diarrhea, or abdominal pain GU: Negative; No dysuria, hematuria, or difficulty voiding Musculoskeletal: Negative; no myalgias, joint pain, or weakness Hematologic/Oncology: Negative; no easy bruising, bleeding Endocrine: Positive for hypothyroidism currently on levothyroxine 137 mcg 6 days a week and one half a pill on Sundays Neuro: Negative; no changes in balance, headaches Skin: Negative; No rashes or skin lesions Psychiatric: Negative; No behavioral problems, depression Sleep: On CPAP followed by Darren Hull since 2012.  He is unaware of breakthrough snoring.  No daytime sleepiness, hypersomnolence, bruxism, restless legs, hypnogognic hallucinations, no cataplexy Other comprehensive 14 point system review is negative.   PHYSICAL EXAM:   VS:  BP 118/74 (BP Location: Left Arm, Patient Position:  Sitting)   Pulse 61   Wt 217 lb (98.4 kg)   SpO2 97%   BMI 32.05 kg/m     Repeat blood pressure by me was excellent at 118/72.  Wt Readings from Last 3 Encounters:  10/14/21 217 lb (98.4 kg)  08/02/14 229 lb (103.9 kg)  05/05/14 226 lb (102.5 kg)    General: Alert, oriented, no distress.  Skin: normal turgor, no rashes, warm and dry HEENT: Normocephalic, atraumatic. Pupils equal round and reactive to light; sclera anicteric; extraocular muscles intact;  Nose without nasal septal hypertrophy Mouth/Parynx benign; Mallinpatti scale 3 Neck: No JVD, no carotid bruits; normal carotid upstroke Lungs: clear to ausculatation and percussion; no wheezing or rales Chest wall: without tenderness to palpitation Heart: PMI not displaced, RRR, s1 s2 normal, 1/6 systolic murmur, no diastolic murmur, no rubs, gallops, thrills, or heaves Abdomen: soft, nontender; no hepatosplenomehaly, BS+; abdominal aorta nontender and not dilated by palpation. Back: no CVA tenderness Pulses 2+ Musculoskeletal: full range of motion, normal strength, no joint deformities Extremities: no clubbing cyanosis or edema, Homan's sign negative  Neurologic: grossly nonfocal; Cranial nerves grossly wnl Psychologic: Normal mood and affect   Studies/Labs Reviewed:   October 14, 2021 ECG (independently read by me): NSR at 61, LAHB, LVH  Recent Labs:    Latest Ref Rng & Units 10/11/2012    5:22 AM 10/23/2009    7:00 AM  BMP  Glucose 70 - 99 mg/dL 161  096   BUN 6 - 23 mg/dL 21  16   Creatinine 0.45 - 1.35 mg/dL 4.09  8.11   Sodium 914 - 145 mEq/L 139  133   Potassium 3.5 - 5.1 mEq/L 5.0  3.8   Chloride 96 - 112 mEq/L 104  102   CO2 19 - 32 mEq/L  24   Calcium 8.4 - 10.5 mg/dL  8.9         Latest Ref Rng & Units 10/23/2009    7:00 AM  Hepatic Function  Total Protein 6.0 - 8.3 g/dL 7.6   Albumin 3.5 - 5.2 g/dL 4.2   AST 0 - 37 U/L 24   ALT 0 - 53 U/L 23   Alk Phosphatase 39 - 117 U/L 41   Total Bilirubin 0.3  - 1.2 mg/dL 1.2  Latest Ref Rng & Units 10/11/2012    5:22 AM 10/11/2012    4:54 AM 10/23/2009    7:00 AM  CBC  WBC 4.0 - 10.5 K/uL  5.1  5.5   Hemoglobin 13.0 - 17.0 g/dL 44.0  34.7  42.5   Hematocrit 39.0 - 52.0 % 47.0  44.7  48.1   Platelets 150 - 400 K/uL  156  208    Lab Results  Component Value Date   MCV 84.2 10/11/2012   MCV 84.4 10/23/2009   No results found for: "TSH" Lab Results  Component Value Date   HGBA1C (H) 10/23/2009    5.9 (NOTE)                                                                       According to the ADA Clinical Practice Recommendations for 2011, when HbA1c is used as a screening test:   >=6.5%   Diagnostic of Diabetes Mellitus           (if abnormal result  is confirmed)  5.7-6.4%   Increased risk of developing Diabetes Mellitus  References:Diagnosis and Classification of Diabetes Mellitus,Diabetes Care,2011,34(Suppl 1):S62-S69 and Standards of Medical Care in         Diabetes - 2011,Diabetes Care,2011,34  (Suppl 1):S11-S61.     BNP No results found for: "BNP"  ProBNP No results found for: "PROBNP"   Lipid Panel     Component Value Date/Time   CHOL (H) 10/23/2009 0823    245        ATP III CLASSIFICATION:  <200     mg/dL   Desirable  956-387  mg/dL   Borderline High  >=564    mg/dL   High          TRIG 332 (H) 10/23/2009 0823   HDL 37 (L) 10/23/2009 0823   CHOLHDL 6.6 10/23/2009 0823   VLDL 31 10/23/2009 0823   LDLCALC (H) 10/23/2009 0823    177        Total Cholesterol/HDL:CHD Risk Coronary Heart Disease Risk Table                     Men   Women  1/2 Average Risk   3.4   3.3  Average Risk       5.0   4.4  2 X Average Risk   9.6   7.1  3 X Average Risk  23.4   11.0        Use the calculated Patient Ratio above and the CHD Risk Table to determine the patient's CHD Risk.        ATP III CLASSIFICATION (LDL):  <100     mg/dL   Optimal  951-884  mg/dL   Near or Above                    Optimal  130-159  mg/dL    Borderline  166-063  mg/dL   High  >016     mg/dL   Very High     RADIOLOGY: No results found.   Additional studies/ records that were reviewed today include:  I have reviewed the patient's prior discharge summary from his October 23, 2009 hospitalization.  Records  of Darren Hull at [facility] medical were reviewed as well as his prior nuclear medicine study, echocardiographic evaluation and CT cardiac calcium score.  ASSESSMENT:    1. Elevated coronary artery calcium score   2. Dyspnea on exertion   3. Aortic dilatation (HCC)   4. Grade II diastolic dysfunction   5. OSA on CPAP   6. Mild obesity   7. 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  He was felt possibly to have had a TIA but definitive diagnosis was established.  At the time he had significant hyperlipidemia with LDL cholesterol at 177.  He also was found to have obstructive sleep apnea and has been on CPAP therapy since 2012.  He denies any significant chest pain.  He underwent a nuclear perfusion study in October 2014 which was normal.  He completed stage IV the Bruce protocol and reached a heart rate of 151 and peak blood pressure 189/74.  In 2019, subsequent echo Doppler study showed normal systolic function and there was evidence for moderate focal basal hypertrophy.  There was grade 2 diastolic dysfunction and there was mild dilatation of his ascending aorta at 40 mm.  Atrial septum was increased in thickness consistent with lipomatous hypertrophy.  He  remained relatively asymptomatic and has been treated with lipid-lowering therapy since his transient neurologic event in 2011.  He underwent CT cardiac scoring per Dr.'s day laboratory which disclosed significant elevation of his calcium score at 1184 agonist on units with calcification predominantly in the RCA and LAD and mildly in the  left circumflex.  Since his retirement in May 2021 he has significantly increased his level of activity and has been successful with weight loss.  He typically walks at least 2 times per day often for 7 to 9 miles without difficulty.  However recently he has noticed with more rapid movement such as playing pickle ball or doing certain Pilates moves he has experienced some increased shortness of breath.  I reviewed office records from Darren Hull.  With his most recent lipid study in May 2023 showing total cholesterol 124 triglycerides 79 LDL 72 and HDL 36 on Zetia 10 mg and rosuvastatin 20 mg, I have suggested even more aggressive treatment and have recommended rosuvastatin dose be increased to 40 mg and attempt to further reduce LDL cholesterol potentially less than 55.  I am scheduled him for follow-up echo Doppler study and right in light of his recent shortness of breath and with his significant calcium score elevation have suggested that he undergo coronary CTA for further evaluation and assessment of potential luminal plaque.  He has been traveling fairly extensively since his retirement.  We will plan to do the studies after he turns 66 on November 07, 2021 when his insurance will change.  In 3 months I have suggested a follow-up comprehensive metabolic panel, lipid studies, LP(a), and I will also check C-reactive protein which had been in the intermediate range when checked in 2011.  He has history of hypothyroidism and currently is on levothyroxine 137 mcg which he takes 6 days/week and one half dose on Sundays with recent TSH in May 31, 2021 at 0.42.  I will contact him with the results of the studies and plan to see him in 3 months for further evaluation.  Medication Adjustments/Labs and Tests Ordered: Current medicines are reviewed at length with the  patient today.  Concerns regarding medicines are outlined above.  Medication changes, Labs and Tests ordered today are listed in the Patient Instructions  below. Patient Instructions  Medication Instructions:  INCREASE rosuvastatin (Crestor) 40 mg daily  *If you need a refill on your cardiac medications before your next appointment, please call your pharmacy*   Lab Work: Please return for FASTING labs in January (CMET, Lipid, LP(a), CRP)  Our in office lab hours are Monday-Friday 8:00-4:00, closed for lunch 12:45-1:45 pm.  No appointment needed.  [facility] locations:   [facility] - [address]  - [address] ([facility]) - [phone] [address]    - [address]    [place]  - [address]    [place] - [address] - [address] [employer]  - [address] - [address] Chief Technology Officer)  Testing/Procedures: Your physician has requested that you have an echocardiogram. Echocardiography is a painless test that uses sound waves to create images of your heart. It provides your doctor with information about the size and shape of your heart and how well your heart's chambers and valves are working. This procedure takes approximately one hour. There are no restrictions for this procedure.  Coronary CTA- see instructions below  Follow-Up: At [facility], you and your health needs are our priority.  As part of our continuing mission to provide you with exceptional heart care, we have created designated Provider Care Teams.  These Care Teams include your primary Cardiologist (physician) and Advanced Practice Providers (APPs -  Physician Assistants and Nurse Practitioners) who all work together to provide you with the care you need, when you need it.  We recommend signing up for the patient portal called "MyChart".  Sign up information is provided on this After Visit Summary.  MyChart is used to connect with patients for Virtual Visits (Telemedicine).  Patients are  able to view lab/test results, encounter notes, upcoming appointments, etc.  Non-urgent messages can be sent to your provider as well.   To learn more about what you can do with MyChart, go to [email or web address].    Your next appointment:   January with Dr. Tresa Endo  Other Instructions   Your cardiac CT will be scheduled at one of the below locations:   [facility] [address] [place], [place] [address] [phone]   If scheduled at [facility], please arrive at the [facility] En[facility] (Entrance C2) of [facility] 30 minutes prior to test start time. You can use the FREE valet parking offered at entrance C (encouraged to control the heart rate for the test)  Proceed to the [facility] Radiology Department (first floor) to check-in and test prep.  All radiology patients and guests should use entrance C2 at [facility], accessed from [facility], even though the hospital's physical address listed is [address].     Please follow these instructions carefully (unless otherwise directed):  Hold all erectile dysfunction medications at least 3 days (72 hrs) prior to test. (Ie viagra, cialis, sildenafil, tadalafil, etc) We will administer nitroglycerin during this exam.   On the Night Before the Test: Be sure to Drink plenty of water. Do not consume any caffeinated/decaffeinated beverages or chocolate 12 hours prior to your test. Do not take  any antihistamines 12 hours prior to your test.  On the Day of the Test: Drink plenty of water until 1 hour prior to the test. Do not eat any food 1 hour prior to test. You may take your regular medications prior to the test.  Take metoprolol (Lopressor) two hours prior to test.  After the Test: Drink plenty of water. After receiving IV contrast, you may experience a mild flushed feeling. This is normal. On occasion, you may experience a mild rash up to 24 hours  after the test. This is not dangerous. If this occurs, you can take Benadryl 25 mg and increase your fluid intake. If you experience trouble breathing, this can be serious. If it is severe call 911 IMMEDIATELY. If it is mild, please call our office. If you take any of these medications: Glipizide/Metformin, Avandament, Glucavance, please do not take 48 hours after completing test unless otherwise instructed.  We will call to schedule your test 2-4 weeks out understanding that some insurance companies will need an authorization prior to the service being performed.   For non-scheduling related questions, please contact the cardiac imaging nurse navigator should you have any questions/concerns: Rockwell Alexandria, Cardiac Imaging Nurse Navigator Larey Brick, Cardiac Imaging Nurse Navigator [facility] Direct Office Dial: [phone]   For scheduling needs, including cancellations and rescheduling, please call [place], [phone].         Signed, [name], MD  10/20/2021 6:56 PM    [facility] [address], [address], [place], [place]  [address] Phone: [phone]

## 2021-10-20 ENCOUNTER — Encounter: Payer: Self-pay | Admitting: Cardiovascular Disease

## 2021-11-14 LAB — BASIC METABOLIC PANEL
BUN/Creatinine Ratio: 17 (ref 10–24)
BUN: 19 mg/dL (ref 8–27)
CO2: 22 mmol/L (ref 20–29)
Calcium: 9.6 mg/dL (ref 8.6–10.2)
Chloride: 102 mmol/L (ref 96–106)
Creatinine, Ser: 1.15 mg/dL (ref 0.76–1.27)
Glucose: 166 mg/dL — ABNORMAL HIGH (ref 70–99)
Potassium: 4.6 mmol/L (ref 3.5–5.2)
Sodium: 138 mmol/L (ref 134–144)
eGFR: 71 mL/min/{1.73_m2} (ref >59)

## 2021-11-15 ENCOUNTER — Other Ambulatory Visit (HOSPITAL_COMMUNITY): Payer: PRIVATE HEALTH INSURANCE

## 2021-11-15 ENCOUNTER — Telehealth (HOSPITAL_COMMUNITY): Payer: Self-pay | Admitting: *Deleted

## 2021-11-15 ENCOUNTER — Ambulatory Visit (HOSPITAL_COMMUNITY): Payer: Medicare Other | Attending: Cardiovascular Disease

## 2021-11-15 DIAGNOSIS — R0609 Other forms of dyspnea: Secondary | ICD-10-CM | POA: Diagnosis present

## 2021-11-15 DIAGNOSIS — I77819 Aortic ectasia, unspecified site: Secondary | ICD-10-CM

## 2021-11-15 LAB — ECHOCARDIOGRAM COMPLETE
Area-P 1/2: 2.08 cm2
S' Lateral: 3 cm

## 2021-11-15 NOTE — Telephone Encounter (Signed)
Reaching out to patient to offer assistance regarding upcoming cardiac imaging study; pt verbalizes understanding of appt date/time, parking situation and where to check in,  medications ordered, and verified current allergies; name and call back number provided for further questions should they arise  Larey Brick RN Navigator Cardiac Imaging [facility] [phone] office [phone] cell  Patient to take 50mg  metoprolol tartrate two hours prior to his cardiac CT if he HR is greater than 65bpm. He is aware to arrive at 8:30am.

## 2021-11-18 ENCOUNTER — Ambulatory Visit (HOSPITAL_COMMUNITY)
Admission: RE | Admit: 2021-11-18 | Discharge: 2021-11-18 | Disposition: A | Discharge status: Home / Self Care | Payer: Medicare Other | Source: Ambulatory Visit | Attending: Cardiovascular Disease | Admitting: Cardiovascular Disease

## 2021-11-18 DIAGNOSIS — R931 Abnormal findings on diagnostic imaging of heart and coronary circulation: Secondary | ICD-10-CM | POA: Diagnosis present

## 2021-11-18 DIAGNOSIS — R0609 Other forms of dyspnea: Secondary | ICD-10-CM | POA: Diagnosis present

## 2021-11-18 DIAGNOSIS — I2510 Atherosclerotic heart disease of native coronary artery without angina pectoris: Secondary | ICD-10-CM | POA: Insufficient documentation

## 2021-11-18 MED ORDER — NITROGLYCERIN 0.4 MG SL SUBL
SUBLINGUAL_TABLET | SUBLINGUAL | Status: AC
  Filled 2021-11-18: qty 2

## 2021-11-18 MED ORDER — NITROGLYCERIN 0.4 MG SL SUBL
0.8000 mg | SUBLINGUAL_TABLET | Freq: Once | SUBLINGUAL | Status: AC
Start: 2021-11-18 — End: 2021-11-18
  Administered 2021-11-18: 0.8 mg via SUBLINGUAL

## 2021-11-18 MED ORDER — IOHEXOL 350 MG/ML SOLN
95.0000 mL | Freq: Once | INTRAVENOUS | Status: AC | PRN
  Administered 2021-11-18: 95 mL via INTRAVENOUS

## 2021-11-19 ENCOUNTER — Ambulatory Visit (HOSPITAL_BASED_OUTPATIENT_CLINIC_OR_DEPARTMENT_OTHER)
Admission: RE | Admit: 2021-11-19 | Discharge: 2021-11-19 | Disposition: A | Discharge status: Home / Self Care | Payer: Medicare Other | Source: Ambulatory Visit | Attending: Cardiology | Admitting: Cardiology

## 2021-11-19 ENCOUNTER — Other Ambulatory Visit (HOSPITAL_COMMUNITY): Payer: Self-pay | Admitting: Emergency Medicine

## 2021-11-19 DIAGNOSIS — R931 Abnormal findings on diagnostic imaging of heart and coronary circulation: Secondary | ICD-10-CM

## 2021-11-19 DIAGNOSIS — I2510 Atherosclerotic heart disease of native coronary artery without angina pectoris: Secondary | ICD-10-CM | POA: Diagnosis not present

## 2021-12-03 ENCOUNTER — Telehealth: Payer: Self-pay | Admitting: Cardiovascular Disease

## 2021-12-03 NOTE — Telephone Encounter (Signed)
Pt is returning call in regards to results. Requesting call back.  

## 2021-12-05 NOTE — Telephone Encounter (Signed)
I called patient, advised of results which he reviewed in mychart himself. 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 So he would like to know if he should do something before this or okay to wait until already scheduled.   I advised I would reach out and discuss with Dr.Kelly.   He verbalized understanding, and thanked me for the call back.

## 2021-12-14 NOTE — Telephone Encounter (Signed)
I called Molly Maduro

## 2021-12-16 ENCOUNTER — Other Ambulatory Visit: Payer: Self-pay | Admitting: *Deleted

## 2021-12-16 MED ORDER — NITROGLYCERIN 0.4 MG SL SUBL: 0.4000 mg | SUBLINGUAL_TABLET | SUBLINGUAL | 3 refills | Status: AC | PRN

## 2021-12-16 NOTE — Progress Notes (Signed)
SL NTG sent to pharmacy per Dr. Tresa Endo

## 2022-01-14 ENCOUNTER — Other Ambulatory Visit: Payer: Self-pay

## 2022-01-14 DIAGNOSIS — R931 Abnormal findings on diagnostic imaging of heart and coronary circulation: Secondary | ICD-10-CM

## 2022-01-14 DIAGNOSIS — R0609 Other forms of dyspnea: Secondary | ICD-10-CM

## 2022-01-14 DIAGNOSIS — E785 Hyperlipidemia, unspecified: Secondary | ICD-10-CM

## 2022-01-15 LAB — COMPREHENSIVE METABOLIC PANEL
ALT: 35 IU/L (ref 0–44)
AST: 26 IU/L (ref 0–40)
Albumin/Globulin Ratio: 1.9 (ref 1.2–2.2)
Albumin: 4.6 g/dL (ref 3.9–4.9)
Alkaline Phosphatase: 52 IU/L (ref 44–121)
BUN/Creatinine Ratio: 12 (ref 10–24)
BUN: 15 mg/dL (ref 8–27)
Bilirubin Total: 0.7 mg/dL (ref 0.0–1.2)
CO2: 24 mmol/L (ref 20–29)
Calcium: 9.7 mg/dL (ref 8.6–10.2)
Chloride: 102 mmol/L (ref 96–106)
Creatinine, Ser: 1.25 mg/dL (ref 0.76–1.27)
Globulin, Total: 2.4 g/dL (ref 1.5–4.5)
Glucose: 135 mg/dL — ABNORMAL HIGH (ref 70–99)
Potassium: 4.7 mmol/L (ref 3.5–5.2)
Sodium: 140 mmol/L (ref 134–144)
Total Protein: 7 g/dL (ref 6.0–8.5)
eGFR: 64 mL/min/{1.73_m2} (ref >59)

## 2022-01-15 LAB — LIPID PANEL
Chol/HDL Ratio: 2.6 ratio (ref 0.0–5.0)
Cholesterol, Total: 103 mg/dL (ref 100–199)
HDL: 40 mg/dL (ref >39)
LDL Chol Calc (NIH): 48 mg/dL (ref 0–99)
Triglycerides: 72 mg/dL (ref 0–149)
VLDL Cholesterol Cal: 15 mg/dL (ref 5–40)

## 2022-01-15 LAB — C-REACTIVE PROTEIN: CRP: 1 mg/L (ref 0–10)

## 2022-01-15 LAB — LIPOPROTEIN A (LPA): Lipoprotein (a): 49 nmol/L (ref <75.0)

## 2022-01-25 ENCOUNTER — Other Ambulatory Visit: Payer: Medicare Other

## 2022-01-29 ENCOUNTER — Encounter: Payer: Self-pay | Admitting: Cardiovascular Disease

## 2022-01-29 ENCOUNTER — Ambulatory Visit: Payer: Medicare Other | Attending: Cardiovascular Disease | Admitting: Cardiovascular Disease

## 2022-01-29 DIAGNOSIS — I77819 Aortic ectasia, unspecified site: Secondary | ICD-10-CM

## 2022-01-29 DIAGNOSIS — E669 Obesity, unspecified: Secondary | ICD-10-CM | POA: Diagnosis present

## 2022-01-29 DIAGNOSIS — R931 Abnormal findings on diagnostic imaging of heart and coronary circulation: Secondary | ICD-10-CM

## 2022-01-29 DIAGNOSIS — E039 Hypothyroidism, unspecified: Secondary | ICD-10-CM | POA: Insufficient documentation

## 2022-01-29 DIAGNOSIS — I5189 Other ill-defined heart diseases: Secondary | ICD-10-CM | POA: Diagnosis present

## 2022-01-29 DIAGNOSIS — E785 Hyperlipidemia, unspecified: Secondary | ICD-10-CM | POA: Diagnosis not present

## 2022-01-29 DIAGNOSIS — G4733 Obstructive sleep apnea (adult) (pediatric): Secondary | ICD-10-CM | POA: Insufficient documentation

## 2022-01-29 DIAGNOSIS — I2510 Atherosclerotic heart disease of native coronary artery without angina pectoris: Secondary | ICD-10-CM | POA: Insufficient documentation

## 2022-01-29 MED ORDER — METOPROLOL SUCCINATE ER 25 MG PO TB24: 25.0000 mg | ORAL_TABLET | Freq: Every day | ORAL | 3 refills | Status: DC

## 2022-01-29 NOTE — Patient Instructions (Addendum)
START METOPROLOL SUCC ER 25 MG ONCE DAILY-START WITH 1/2 TABLET DAILY   Follow-Up: At [facility], you and your health needs are our priority.  As part of our continuing mission to provide you with exceptional heart care, we have created designated Provider Care Teams.  These Care Teams include your primary Cardiologist (physician) and Advanced Practice Providers (APPs -  Physician Assistants and Nurse Practitioners) who all work together to provide you with the care you need, when you need it.  We recommend signing up for the patient portal called "MyChart".  Sign up information is provided on this After Visit Summary.  MyChart is used to connect with patients for Virtual Visits (Telemedicine).  Patients are able to view lab/test results, encounter notes, upcoming appointments, etc.  Non-urgent messages can be sent to your provider as well.   To learn more about what you can do with MyChart, go to [email or web address].    Your next appointment:   3 month(s)  Provider:   Shelva Majestic MD

## 2022-01-29 NOTE — Progress Notes (Signed)
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  In 2011, he developed new onset abnormal sensation in his right face, arm and leg and presented to [facility].  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  An MRI showed ventricular size to be normal and there was a 7 to 11 mm benign cyst in the right basal ganglier.  Cerebral tonsils and foramen magnum were within normal limits.  He was evaluated by [name] and was felt possibly at that time to have suffered a left brainstem or subcortical transient ischemic activity, but ultimately according to 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  At the time, he was started on Plavix as well as lipid-lowering therapy with Crestor after his lipid panel showed total cholesterol 245, triglycerides 157, HDL 37, LDL 177.  He was subsequently evaluated by me but I do not have records of my office note from the [facility].  He had undergone an echo Doppler study at that time and also underwent advanced lipid testing with [facility] heart lab on October 31, 2009.  ApoB was 103.  C-reactive protein was intermediary at 2.5 mg/L.  Reportedly LP(a) was normal.  He was subsequently referred to Dr. Porfirio Mylar Dohmeier and has been on CPAP therapy for obstructive sleep apnea.  In 2014, he was underwent a nuclear stress test in which he completed stage IV the Bruce protocol without ECG changes and had normal myocardial perfusion with EF at 62% and normal wall motion.  In 2019  he underwent a 2D echo Doppler study on July 24, 2017 which showed normal systolic function with EF 55 to 60%.  There was  moderate focal basal hypertrophy.  Wall motion was normal.  There was grade 2 diastolic dysfunction.  He was noted to have mild dilatation of ascending aorta at 40 mm.  There was mild left atrial dilatation.  There was no significant valvular pathology.  As part of [facility] he underwent a CT cardiac calcium score on April 20, 2020 which revealed an elevated calcium score at 1184 Agaston units: LAD calcification was 367, left circumflex 125, and RCA 693  units.  He has been followed by [name] at [facility] for his primary care.  Most recently, he has been on rosuvastatin 20 mg and Zetia 10 mg for hyperlipidemia, vitamin D 2000 units daily, AndroGel transdermally, 81 mg aspirin, clopidogrel 75 mg, olmesartan 20 mg, and has a prescription for sildenafil to take as needed.  He recently saw [name] and had noticed in April/May 2023 while playing pickle ball with increased heart rate he developed some trouble catching his breath.  This occurred when he was playing on a very hot afternoon.  Since his retirement 2 years ago, he exercises regularly and typically may walk 7-1/2 miles per day.  In addition he has been doing Pilates 2 times per week.  He has noticed occasional shortness of breath while doing Pilates but denies any associated chest pain.  Laboratory in May 2022 on rosuvastatin 10 mg total cholesterol was 151, triglycerides  70, HDL 51 and LDL 84.  Rosuvastatin dose was increased to 20 mg and subsequent laboratory in September 2022 showed total cholesterol 124, triglycerides 84, HDL 46 and LDL 61.  Most recent laboratory in May 2023 showed total cholesterol 124, triglycerides 79, HDL 36 and LDL 72.  With his recent awareness of some exertional shortness of breath while playing pickle ball and Pilates he presented to reestablish cardiology care after I had not seen him in the office in many years.  When I saw him for my evaluation on October 14, 2021, he was feeling well and denied any  chest pain.  At times he was walking up to 10 miles per day with several long walks without symptoms.  Follow-up lipid studies in May 2023 by [name] showed a total cholesterol 124, triglycerides 79, LDL 72 and HDL 36 on Zetia 10 mg and rosuvastatin 20 mg.  I suggested more aggressive treatment and recommended he increase rosuvastatin to 40 mg and attempt to reduce LDL cholesterol less than 55 or better.  I recommended he undergo an echo Doppler as well as coronary CTA evaluation.  His echo Doppler study on November 15, 2021 continue to show normal LV function with EF 60 to 65% with normal wall motion.  There was grade 2 diastolic dysfunction.  He had normal pulmonary pressures.  RV size and function was normal.  There was very mild LA dilation and very mild dilation of aortic root at 39 mm and ascending aorta at 38 mm.  Coronary CTA on November 18, 2021  revealed a calcium score at 1524 Agatston units.  Left main was normal.  There was mixed plaque in the proximal LAD with moderate stenosis of 50 to 69%.  The circumflex vessel had 1 to 24% stenosis proximally and 25 to 49% in the AV groove circumflex.  The RCA had extensive mixed plaque and there was concern for possible severe stenosis in the distal RCA with FFR 0.73 in the proximal PDA and 0.72 in the proximal PLV.  Following his noninvasive studies, I contacted the patient.  He was planning a 2-week trip to [place].  At that time he continued to be asymptomatic.  In light of his CTA findings, I called in a prescription for as needed sublingual nitroglycerin.   He underwent follow-up laboratory upon his return from his 2-week trip and total cholesterol is now 103, triglycerides 72, HDL 40, and LDL cholesterol 48 on Zetia 10 mg and rosuvastatin 40 mg.  LP(a) was normal at 49.  Presently, he remains asymptomatic with his extensive walking of at least 6-8 times up to 14 miles per day.  On his trip he was walking on a treadmill without difficulty.   Only rarely has he noticed some mild shortness of breath with HIT Pilates. He and his wife presents to the office today for follow-up evaluation   Past Medical History:  Diagnosis Date   Hypercholesteremia    Hypertension    OSA on CPAP 05/05/2014   Thyroid disease    TIA (transient ischemic attack)     Past Surgical History:  Procedure Laterality Date   adnoidectomy     VASECTOMY      Current Medications: Outpatient Medications Prior to Visit  Medication Sig Dispense Refill   aspirin 81 MG tablet Take 81 mg by mouth every morning.      Cholecalciferol (VITAMIN D) 2000 UNITS tablet Take 2,000 Units by mouth every morning.      clopidogrel (  PLAVIX) 75 MG tablet Take 75 mg by mouth every morning.      ezetimibe (ZETIA) 10 MG tablet Take 10 mg by mouth every morning.      folic acid (FOLVITE) 800 MCG tablet Take 800 mcg by mouth every morning.      levothyroxine (SYNTHROID, LEVOTHROID) 137 MCG tablet Take 137 mcg by mouth daily before breakfast. Takes half tablets on Sundays and a whole tablet on Monday - Saturday.     metoprolol tartrate (LOPRESSOR) 50 MG tablet Take 50 mg (1 tablet) TWO hours prior to CT scan 1 tablet 0   nitroGLYCERIN (NITROSTAT) 0.4 MG SL tablet Place 1 tablet (0.4 mg total) under the tongue every 5 (five) minutes as needed. 25 tablet 3   olmesartan (BENICAR) 20 MG tablet Take 20 mg by mouth every morning.      rosuvastatin (CRESTOR) 40 MG tablet Take 1 tablet (40 mg total) by mouth every morning. 90 tablet 3   sildenafil (VIAGRA) 100 MG tablet TAKE 1 TABLET EVERY DAY AS NEEDED Oral     Testosterone (ANDROGEL) 20.25 MG/1.25GM (1.62%) GEL Apply 2 pumps daily as directed Transdermal     zaleplon (SONATA) 10 MG capsule Take 10 mg by mouth at bedtime as needed.     No facility-administered medications prior to visit.     Allergies:   Hydrochlorothiazide   Social History   Socioeconomic History   Marital status: Married    Spouse name: Not on file   Number of  children: Not on file   Years of education: MD   Highest education level: Not on file  Occupational History   Occupation: neurosurgeon  Tobacco Use   Smoking status: Never   Smokeless tobacco: Not on file  Substance and Sexual Activity   Alcohol use: No    Alcohol/week: 0.0 standard drinks of alcohol    Comment: occasional glass of wine 2x weekly   Drug use: Not on file   Sexual activity: Not on file  Other Topics Concern   Not on file  Social History Narrative   Drinks average of 1 cup coffee daily.   Social Determinants of Health   Financial Resource Strain: Not on file  Food Insecurity: Not on file  Transportation Needs: Not on file  Physical Activity: Not on file  Stress: Not on file  Social Connections: Not on file    Socially he attended [employer] and was in the 6-year college/medical school program.  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  He exercises regularly and walks at least 7/2 miles per day.  He is married to Jaynie Collins and both his daughters are Engineer, manufacturing systems.  There is no tobacco use.  Family History:  The patient's family history includes Atrial fibrillation in his mother; Cerebral palsy in his brother; Lymphoma in his father; Pulmonary embolism in his brother; Stroke in his mother.   ROS General: Negative; No fevers, chills, or night sweats;  HEENT: Negative; No changes in vision or hearing, sinus congestion, difficulty swallowing Pulmonary: Negative; No cough, wheezing, shortness of breath, hemoptysis Cardiovascular: Negative; No chest pain, presyncope, syncope, palpitations GI: Negative; No nausea, vomiting, diarrhea, or abdominal pain GU: Negative; No dysuria, hematuria, or difficulty voiding Musculoskeletal: Negative; no myalgias, joint pain, or weakness Hematologic/Oncology: Negative; no easy bruising, bleeding Endocrine: Positive for hypothyroidism currently on levothyroxine 137 mcg 6 days a week and one half a pill on  Sundays Neuro: Negative; no changes in balance, headaches Skin: Negative; No  rashes or skin lesions Psychiatric: Negative; No behavioral problems, depression Sleep: On CPAP followed by Dr. Porfirio Mylar Dohmeier since 2012.  He is unaware of breakthrough snoring.  No daytime sleepiness, hypersomnolence, bruxism, restless legs, hypnogognic hallucinations, no cataplexy Other comprehensive 14 point system review is negative.   PHYSICAL EXAM:   VS:  BP 112/70   Pulse 64   Ht 5\' 8"  (1.727 m)   Wt 228 lb 3.2 oz (103.5 kg)   SpO2 95%   BMI 34.70 kg/m     Repeat blood pressure by me was excellent at 120/70  Wt Readings from Last 3 Encounters:  01/29/22 228 lb 3.2 oz (103.5 kg)  10/14/21 217 lb (98.4 kg)  08/02/14 229 lb (103.9 kg)   General: Alert, oriented, no distress.  Skin: normal turgor, no rashes, warm and dry HEENT: Normocephalic, atraumatic. Pupils equal round and reactive to light; sclera anicteric; extraocular muscles intact;  Nose without nasal septal hypertrophy Mouth/Parynx benign; Mallinpatti scale 3 Neck: No JVD, no carotid bruits; normal carotid upstroke Lungs: clear to ausculatation and percussion; no wheezing or rales Chest wall: without tenderness to palpitation Heart: PMI not displaced, RRR, s1 s2 normal, 1/6 systolic murmur, no diastolic murmur, no rubs, gallops, thrills, or heaves Abdomen: soft, nontender; no hepatosplenomehaly, BS+; abdominal aorta nontender and not dilated by palpation. Back: no CVA tenderness Pulses 2+ Musculoskeletal: full range of motion, normal strength, no joint deformities Extremities: no clubbing cyanosis or edema, Homan's sign negative  Neurologic: grossly nonfocal; Cranial nerves grossly wnl Psychologic: Normal mood and affect    Studies/Labs Reviewed:   January 29, 2022 ECG (independently read by me): Normal sinus rhythm at 64 bpm but very mild sinus arrhythmia.  Left anterior hemiblock, unchanged.  Borderline LVH.  Early  transition.  October 14, 2021 ECG (independently read by me): NSR at 61, LAHB, LVH  Recent Labs:    Latest Ref Rng & Units 01/14/2022    9:50 AM 11/13/2021    2:32 PM 10/11/2012    5:22 AM  BMP  Glucose 70 - 99 mg/dL 604  540  981   BUN 8 - 27 mg/dL 15  19  21    Creatinine 0.76 - 1.27 mg/dL 1.91  4.78  2.95   BUN/Creat Ratio 10 - 24 12  17     Sodium 134 - 144 mmol/L 140  138  139   Potassium 3.5 - 5.2 mmol/L 4.7  4.6  5.0   Chloride 96 - 106 mmol/L 102  102  104   CO2 20 - 29 mmol/L 24  22    Calcium 8.6 - 10.2 mg/dL 9.7  9.6          Latest Ref Rng & Units 01/14/2022    9:50 AM 10/23/2009    7:00 AM  Hepatic Function  Total Protein 6.0 - 8.5 g/dL 7.0  7.6   Albumin 3.9 - 4.9 g/dL 4.6  4.2   AST 0 - 40 IU/L 26  24   ALT 0 - 44 IU/L 35  23   Alk Phosphatase 44 - 121 IU/L 52  41   Total Bilirubin 0.0 - 1.2 mg/dL 0.7  1.2        Latest Ref Rng & Units 10/11/2012    5:22 AM 10/11/2012    4:54 AM 10/23/2009    7:00 AM  CBC  WBC 4.0 - 10.5 K/uL  5.1  5.5   Hemoglobin 13.0 - 17.0 g/dL 62.1  30.8  65.7   Hematocrit 39.0 -  52.0 % 47.0  44.7  48.1   Platelets 150 - 400 K/uL  156  208    Lab Results  Component Value Date   MCV 84.2 10/11/2012   MCV 84.4 10/23/2009   No results found for: "TSH" Lab Results  Component Value Date   HGBA1C (H) 10/23/2009    5.9 (NOTE)                                                                       According to the ADA Clinical Practice Recommendations for 2011, when HbA1c is used as a screening test:   >=6.5%   Diagnostic of Diabetes Mellitus           (if abnormal result  is confirmed)  5.7-6.4%   Increased risk of developing Diabetes Mellitus  References:Diagnosis and Classification of Diabetes Mellitus,Diabetes Care,2011,34(Suppl 1):S62-S69 and Standards of Medical Care in         Diabetes - 2011,Diabetes Care,2011,34  (Suppl 1):S11-S61.     BNP No results found for: "BNP"  ProBNP No results found for: "PROBNP"   Lipid Panel      Component Value Date/Time   CHOL 103 01/14/2022 0950   TRIG 72 01/14/2022 0950   HDL 40 01/14/2022 0950   CHOLHDL 2.6 01/14/2022 0950   CHOLHDL 6.6 10/23/2009 0823   VLDL 31 10/23/2009 0823   LDLCALC 48 01/14/2022 0950   LABVLDL 15 01/14/2022 0950     RADIOLOGY: No results found.   Additional studies/ records that were reviewed today include:  I have reviewed the patient's prior discharge summary from his October 23, 2009 hospitalization.  Records of [name] at [facility] were reviewed as well as his prior nuclear medicine study, echocardiographic evaluation and CT cardiac calcium score.  ASSESSMENT:    1. CAD in native artery   2. Elevated coronary artery calcium score: 1524   3. OSA on CPAP   4. Hyperlipidemia with target LDL less than 55.   5. Grade II diastolic dysfunction   6. Mild obesity   7. Aortic dilatation (HCC)   8. 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  He was felt possibly to have had a TIA but definitive diagnosis was not established.  At the time he had significant hyperlipidemia with LDL cholesterol at 177.  He also was found to have obstructive sleep apnea and has been on CPAP therapy since 2012.  He denies any significant chest pain.  He underwent a nuclear perfusion study in October 2014 which was normal.  He completed stage IV the Bruce protocol and reached a heart rate of 151 and peak blood pressure 189/74.  In 2019, subsequent echo Doppler study showed normal systolic function and there was evidence for moderate focal basal hypertrophy.  There was grade 2 diastolic dysfunction and there was mild dilatation of his ascending aorta at 40 mm.  Atrial septum was increased in thickness consistent with lipomatous hypertrophy.  He  remained asymptomatic and has been treated with lipid-lowering therapy since his  transient neurologic event in 2011.  He underwent CT cardiac scoring per [facility]  in 2021 which disclosed significant elevation of his calcium score at 1184 Agatston units with calcification predominantly in the RCA and LAD and mildly in the left circumflex.  Since his retirement in May 2021 he has significantly increased his level of activity and has been successful with weight loss.  He typically walks at least 2 times per day often for 7 to 10 miles without difficulty.  However recently he had noticed with more rapid movement such as playing pickle ball on a very hot day or doing certain Pilates moves he has experienced some increased shortness of breath.  I reviewed office records from [name].  When I saw him in October 2023 with his LDL cholesterol of 72 on Zetia 10 mg and rosuvastatin 20 mg I recommended more aggressive therapy and titrated rosuvastatin to 40 mg daily with target LDL less than 55.  Subsequent laboratory on January 14, 2022 has now showed LDL cholesterol at 48, with total cholesterol 103, triglycerides 72 and HDL 40.  He continues to have normal LV systolic function noted on echocardiography in November 2023 and had grade 2 diastolic dysfunction.  I spent over 40 minutes with he and his wife today in the office.  I thoroughly reviewed his coronary CTA findings with him in the office today and had previously discussed this with him at length prior to his trip to [place].  He was given a sublingual as needed prescription for nitroglycerin which she never used.  He continued to exercise regularly on his trip.  Since back in [place] he continues to walk at least 7 miles per day but at times has walked up to 14 miles per day without chest pain.  He typically can exercise and his heart rate reached up to 115 without chest pain or shortness of breath.  I had a very lengthy discussion with both he and his wife today.  I discussed the hemodynamic significance of his distal RCA  calcified stenosis.  I discussed potential definitive evaluation with cardiac catheterization with possible need for atherectomy or intravascular lithotripsy if significant calcification was present.  The risk benefits of the procedure were discussed in detail.  However, he presently continues to be asymptomatic and is not on any medical therapy.  We discussed initiation of low-dose beta-blocker therapy as initial antianginal benefit as well as continued optimal blood pressure and heart rate control.  I discussed risk benefits of the catheterization as well as potential coronary intervention.  After much discussion, at present we will pursue initial medical regimen.  He was instructed to contact me if he does note any change in symptomatology.  I will arrange for follow-up office visit in 3 months for reassessment or sooner as needed.    Medication Adjustments/Labs and Tests Ordered: Current medicines are reviewed at length with the patient today.  Concerns regarding medicines are outlined above.  Medication changes, Labs and Tests ordered today are listed in the Patient Instructions below. Patient Instructions  START METOPROLOL SUCC ER 25 MG ONCE DAILY-START WITH 1/2 TABLET DAILY   Follow-Up: At [facility], you and your health needs are our priority.  As part of our continuing mission to provide you with exceptional heart care, we have created designated Provider Care Teams.  These Care Teams include your primary Cardiologist (physician) and Advanced Practice Providers (APPs -  Physician Assistants and Nurse Practitioners) who all work together to provide you with the care you need, when you need it.  We recommend signing  up for the patient portal called "MyChart".  Sign up information is provided on this After Visit Summary.  MyChart is used to connect with patients for Virtual Visits (Telemedicine).  Patients are able to view lab/test results, encounter notes, upcoming appointments, etc.   Non-urgent messages can be sent to your provider as well.   To learn more about what you can do with MyChart, go to [email or web address].    Your next appointment:   3 month(s)  Provider:   [name] MD      Signed, [name], MD  02/01/2022 11:03 AM    [facility] [address], [address], [place], [place]  [address] Phone: [phone]

## 2022-02-01 ENCOUNTER — Encounter: Payer: Self-pay | Admitting: Cardiovascular Disease

## 2022-04-14 ENCOUNTER — Encounter: Payer: Self-pay | Admitting: Cardiovascular Disease

## 2022-04-14 ENCOUNTER — Ambulatory Visit: Payer: Medicare Other | Attending: Cardiovascular Disease | Admitting: Cardiovascular Disease

## 2022-04-14 VITALS — BP 116/68 | HR 61 | Ht 68.0 in | Wt 223.4 lb

## 2022-04-14 DIAGNOSIS — I77819 Aortic ectasia, unspecified site: Secondary | ICD-10-CM | POA: Diagnosis present

## 2022-04-14 DIAGNOSIS — E785 Hyperlipidemia, unspecified: Secondary | ICD-10-CM | POA: Diagnosis present

## 2022-04-14 DIAGNOSIS — G4733 Obstructive sleep apnea (adult) (pediatric): Secondary | ICD-10-CM | POA: Diagnosis present

## 2022-04-14 DIAGNOSIS — E039 Hypothyroidism, unspecified: Secondary | ICD-10-CM | POA: Insufficient documentation

## 2022-04-14 DIAGNOSIS — I2510 Atherosclerotic heart disease of native coronary artery without angina pectoris: Secondary | ICD-10-CM | POA: Diagnosis present

## 2022-04-14 DIAGNOSIS — R931 Abnormal findings on diagnostic imaging of heart and coronary circulation: Secondary | ICD-10-CM | POA: Diagnosis present

## 2022-04-14 DIAGNOSIS — I5189 Other ill-defined heart diseases: Secondary | ICD-10-CM | POA: Insufficient documentation

## 2022-04-14 NOTE — Patient Instructions (Signed)
Medication Instructions:  Your physician recommends that you continue on your current medications as directed. Please refer to the Current Medication list given to you today.  *If you need a refill on your cardiac medications before your next appointment, please call your pharmacy*  Follow-Up: At [facility], you and your health needs are our priority.  As part of our continuing mission to provide you with exceptional heart care, we have created designated Provider Care Teams.  These Care Teams include your primary Cardiologist (physician) and Advanced Practice Providers (APPs -  Physician Assistants and Nurse Practitioners) who all work together to provide you with the care you need, when you need it.  We recommend signing up for the patient portal called "MyChart".  Sign up information is provided on this After Visit Summary.  MyChart is used to connect with patients for Virtual Visits (Telemedicine).  Patients are able to view lab/test results, encounter notes, upcoming appointments, etc.  Non-urgent messages can be sent to your provider as well.   To learn more about what you can do with MyChart, go to [email or web address].    Your next appointment:   November with Dr. Tresa Endo

## 2022-04-14 NOTE — Progress Notes (Signed)
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  In 2011, he developed new onset abnormal sensation in his right face, arm and leg and presented to [facility].  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  An MRI showed ventricular size to be normal and there was a 7 to 11 mm benign cyst in the right basal ganglier.  Cerebral tonsils and foramen magnum were within normal limits.  He was evaluated by [name] and was felt possibly at that time to have suffered a left brainstem or subcortical transient ischemic activity, but ultimately according to 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  At the time, he was started on Plavix as well as lipid-lowering therapy with Crestor after his lipid panel showed total cholesterol 245, triglycerides 157, HDL 37, LDL 177.  He was subsequently evaluated by me but I do not have records of my office note from the [facility].  He had undergone an echo Doppler study at that time and also underwent advanced lipid testing with [facility] heart lab on October 31, 2009.  ApoB was 103.  C-reactive protein was intermediary at 2.5 mg/L.  Reportedly LP(a) was normal.  He was subsequently referred to Dr. Porfirio Mylar Dohmeier and has been on CPAP therapy for obstructive sleep apnea.  In 2014, he was underwent a nuclear stress test in which he completed stage IV the Bruce protocol without ECG changes and had normal myocardial perfusion with EF at 62% and normal wall motion.  In 2019  he underwent a 2D echo Doppler study on July 24, 2017 which showed normal systolic function with EF 55 to 60%.  There was  moderate focal basal hypertrophy.  Wall motion was normal.  There was grade 2 diastolic dysfunction.  He was noted to have mild dilatation of ascending aorta at 40 mm.  There was mild left atrial dilatation.  There was no significant valvular pathology.  As part of [facility] he underwent a CT cardiac calcium score on April 20, 2020 which revealed an elevated calcium score at 1184 Agaston units: LAD calcification was 367, left circumflex 125, and RCA 693  units.  He has been followed by [name] at [facility] for his primary care.  Most recently, he has been on rosuvastatin 20 mg and Zetia 10 mg for hyperlipidemia, vitamin D 2000 units daily, AndroGel transdermally, 81 mg aspirin, clopidogrel 75 mg, olmesartan 20 mg, and has a prescription for sildenafil to take as needed.  He recently saw [name] and had noticed in April/May 2023 while playing pickle ball with increased heart rate he developed some trouble catching his breath.  This occurred when he was playing on a very hot afternoon.  Since his retirement 2 years ago, he exercises regularly and typically may walk 7-1/2 miles per day.  In addition he has been doing Pilates 2 times per week.  He has noticed occasional shortness of breath while doing Pilates but denies any associated chest pain.  Laboratory in May 2022 on rosuvastatin 10 mg total cholesterol was 151, triglycerides 70,  HDL 51 and LDL 84.  Rosuvastatin dose was increased to 20 mg and subsequent laboratory in September 2022 showed total cholesterol 124, triglycerides 84, HDL 46 and LDL 61.  Most recent laboratory in May 2023 showed total cholesterol 124, triglycerides 79, HDL 36 and LDL 72.  With his recent awareness of some exertional shortness of breath while playing pickle ball and Pilates he presented to reestablish cardiology care after I had not seen him in the office in many years.  When I saw him for my evaluation on October 14, 2021, he was feeling well and denied any  chest pain.  At times he was walking up to 10 miles per day with several long walks without symptoms.  Follow-up lipid studies in May 2023 by [name] showed a total cholesterol 124, triglycerides 79, LDL 72 and HDL 36 on Zetia 10 mg and rosuvastatin 20 mg.  I suggested more aggressive treatment and recommended he increase rosuvastatin to 40 mg and attempt to reduce LDL cholesterol less than 55 or better.  I recommended he undergo an echo Doppler as well as coronary CTA evaluation.  His echo Doppler study on November 15, 2021 continue to show normal LV function with EF 60 to 65% with normal wall motion.  There was grade 2 diastolic dysfunction.  He had normal pulmonary pressures.  RV size and function was normal.  There was very mild LA dilation and very mild dilation of aortic root at 39 mm and ascending aorta at 38 mm.  Coronary CTA on November 18, 2021  revealed a calcium score at 1524 Agatston units.  Left main was normal.  There was mixed plaque in the proximal LAD with moderate stenosis of 50 to 69%.  The circumflex vessel had 1 to 24% stenosis proximally and 25 to 49% in the AV groove circumflex.  The RCA had extensive mixed plaque and there was concern for possible severe stenosis in the distal RCA with FFR 0.73 in the proximal PDA and 0.72 in the proximal PLV.  Following his noninvasive studies, I contacted the patient.  He was planning a 2-week trip to [place].  At that time he continued to be asymptomatic.  In light of his CTA findings, I called in a prescription for as needed sublingual nitroglycerin.   He underwent follow-up laboratory upon his return from his 2-week trip and total cholesterol is now 103, triglycerides 72, HDL 40, and LDL cholesterol 48 on Zetia 10 mg and rosuvastatin 40 mg.  LP(a) was normal at 49.  I saw him in follow-up on January 29, 2022 and he remained, he asymptomatic with his extensive walking of at least 6-8 times up to 14 miles per day.  On his [place] trip he  was walking on a treadmill without difficulty.  Only rarely has he noticed some mild shortness of breath with HIT Pilates. He and his wife presents to the office for follow-up evaluation. During that evaluation, I had an extensive discussion with both he and his wife.  I discussed the hemodynamic significance of his distal RCA calcified stenosis.  I discussed potential definitive evaluation with cardiac catheterization with possible need for atherectomy or intravascular lithotripsy if significant calcification was present and the risk/benefits of the procedure were discussed in detail.  At the time, he continued to be asymptomatic and was not on any medical therapy.  We discussed continued close surveillance.  I elected to initiate low-dose beta-blocker therapy as both initial antianginal benefit as well as continued  optimal blood pressure and heart rate control.  After much discussion he was felt to pursue the initial medical regimen.  Since I last saw him, he has continued to be stable.  He does note some slight fatigue since initiating rosuvastatin but denies any associated symptoms.  He continues to walk at least 6 miles per day and denies any exertional symptoms of chest pain or shortness of breath.  He recently underwent laboratory at [facility] but has not yet received the results.  Both his daughters are attorneys and 1 recently had her first baby locally.  His other daughter who lives in [place] and will be having a baby in June for which he and his wife plan to be up there and assist for approximately a month.  He presents for evaluation   Past Medical History:  Diagnosis Date   Hypercholesteremia    Hypertension    OSA on CPAP 05/05/2014   Thyroid disease    TIA (transient ischemic attack)     Past Surgical History:  Procedure Laterality Date   adnoidectomy     VASECTOMY      Current Medications: Outpatient Medications Prior to Visit  Medication Sig Dispense Refill   aspirin 81 MG  tablet Take 81 mg by mouth every morning.      Cholecalciferol (VITAMIN D) 2000 UNITS tablet Take 2,000 Units by mouth every morning.      clopidogrel (PLAVIX) 75 MG tablet Take 75 mg by mouth every morning.      ezetimibe (ZETIA) 10 MG tablet Take 10 mg by mouth every morning.      folic acid (FOLVITE) 800 MCG tablet Take 800 mcg by mouth every morning.      levothyroxine (SYNTHROID, LEVOTHROID) 137 MCG tablet Take 137 mcg by mouth daily before breakfast. Takes half tablets on Sundays and a whole tablet on Monday - Saturday.     metoprolol succinate (TOPROL XL) 25 MG 24 hr tablet Take 1 tablet (25 mg total) by mouth daily. 90 tablet 3   olmesartan (BENICAR) 20 MG tablet Take 20 mg by mouth every morning.      rosuvastatin (CRESTOR) 40 MG tablet Take 1 tablet (40 mg total) by mouth every morning. 90 tablet 3   sildenafil (VIAGRA) 100 MG tablet TAKE 1 TABLET EVERY DAY AS NEEDED Oral     Testosterone (ANDROGEL) 20.25 MG/1.25GM (1.62%) GEL Apply 2 pumps daily as directed Transdermal     zaleplon (SONATA) 10 MG capsule Take 10 mg by mouth at bedtime as needed.     nitroGLYCERIN (NITROSTAT) 0.4 MG SL tablet Place 1 tablet (0.4 mg total) under the tongue every 5 (five) minutes as needed. 25 tablet 3   metoprolol tartrate (LOPRESSOR) 50 MG tablet Take 50 mg (1 tablet) TWO hours prior to CT scan 1 tablet 0   No facility-administered medications prior to visit.     Allergies:   Hydrochlorothiazide   Social History   Socioeconomic History   Marital status: Married    Spouse name: Not on file   Number of children: Not on file   Years of education: MD   Highest education level: Not on file  Occupational History   Occupation: neurosurgeon  Tobacco Use   Smoking status: Never   Smokeless tobacco: Not on file  Substance and Sexual Activity   Alcohol use: No    Alcohol/week: 0.0 standard drinks of alcohol    Comment: occasional glass of wine 2x weekly   Drug use: Not  on file   Sexual activity:  Not on file  Other Topics Concern   Not on file  Social History Narrative   Drinks average of 1 cup coffee daily.   Social Determinants of Health   Financial Resource Strain: Not on file  Food Insecurity: Not on file  Transportation Needs: Not on file  Physical Activity: Not on file  Stress: Not on file  Social Connections: Not on file    Socially he attended [employer] and was in the 6-year college/medical school program.  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  He exercises regularly and walks at least 7 miles per day.  He is married to Jaynie Collins and both his daughters are Engineer, manufacturing systems.  There is no tobacco use.  Family History:  The patient's family history includes Atrial fibrillation in his mother; Cerebral palsy in his brother; Lymphoma in his father; Pulmonary embolism in his brother; Stroke in his mother.   ROS General: Negative; No fevers, chills, or night sweats;  HEENT: Negative; No changes in vision or hearing, sinus congestion, difficulty swallowing Pulmonary: Negative; No cough, wheezing, shortness of breath, hemoptysis Cardiovascular: Negative; No chest pain, presyncope, syncope, palpitations GI: Negative; No nausea, vomiting, diarrhea, or abdominal pain GU: Negative; No dysuria, hematuria, or difficulty voiding Musculoskeletal: Negative; no myalgias, joint pain, or weakness Hematologic/Oncology: Negative; no easy bruising, bleeding Endocrine: Positive for hypothyroidism currently on levothyroxine 137 mcg 6 days a week and one half a pill on Sundays Neuro: Negative; no changes in balance, headaches Skin: Negative; No rashes or skin lesions Psychiatric: Negative; No behavioral problems, depression Sleep: On CPAP followed by Dr. Porfirio Mylar Dohmeier since 2012.  He is unaware of breakthrough snoring.  No daytime sleepiness, hypersomnolence, bruxism, restless legs, hypnogognic hallucinations, no cataplexy Other comprehensive 14 point system review is  negative.   PHYSICAL EXAM:   VS:  BP 116/68   Pulse 61   Ht 5\' 8"  (1.727 m)   Wt 223 lb 6.4 oz (101.3 kg)   SpO2 98%   BMI 33.97 kg/m     Repeat blood pressure by me was excellent at 116/62  Wt Readings from Last 3 Encounters:  04/14/22 223 lb 6.4 oz (101.3 kg)  01/29/22 228 lb 3.2 oz (103.5 kg)  10/14/21 217 lb (98.4 kg)   General: Alert, oriented, no distress.  Skin: normal turgor, no rashes, warm and dry HEENT: Normocephalic, atraumatic. Pupils equal round and reactive to light; sclera anicteric; extraocular muscles intact;  Nose without nasal septal hypertrophy Mouth/Parynx benign; Mallinpatti scale 3 Neck: No JVD, no carotid bruits; normal carotid upstroke Lungs: clear to ausculatation and percussion; no wheezing or rales Chest wall: without tenderness to palpitation Heart: PMI not displaced, RRR, s1 s2 normal, 1/6 systolic murmur, no diastolic murmur, no rubs, gallops, thrills, or heaves Abdomen: soft, nontender; no hepatosplenomehaly, BS+; abdominal aorta nontender and not dilated by palpation. Back: no CVA tenderness Pulses 2+ Musculoskeletal: full range of motion, normal strength, no joint deformities Extremities: no clubbing cyanosis or edema, Homan's sign negative  Neurologic: grossly nonfocal; Cranial nerves grossly wnl Psychologic: Normal mood and affect       Studies/Labs Reviewed:   April 14, 2022 ECG (independently read by me): NSR at 61 bpm, mild sinus arrythmia, normal intervals  January 29, 2022 ECG (independently read by me): Normal sinus rhythm at 64 bpm but very mild sinus arrhythmia.  Left anterior hemiblock, unchanged.  Borderline LVH.  Early transition.  October 14, 2021 ECG (independently read  by me): NSR at 61, LAHB, LVH  Recent Labs:    Latest Ref Rng & Units 01/14/2022    9:50 AM 11/13/2021    2:32 PM 10/11/2012    5:22 AM  BMP  Glucose 70 - 99 mg/dL 161  096  045   BUN 8 - 27 mg/dL 15  19  21    Creatinine 0.76 - 1.27 mg/dL 4.09  8.11   9.14   BUN/Creat Ratio 10 - 24 12  17     Sodium 134 - 144 mmol/L 140  138  139   Potassium 3.5 - 5.2 mmol/L 4.7  4.6  5.0   Chloride 96 - 106 mmol/L 102  102  104   CO2 20 - 29 mmol/L 24  22    Calcium 8.6 - 10.2 mg/dL 9.7  9.6          Latest Ref Rng & Units 01/14/2022    9:50 AM 10/23/2009    7:00 AM  Hepatic Function  Total Protein 6.0 - 8.5 g/dL 7.0  7.6   Albumin 3.9 - 4.9 g/dL 4.6  4.2   AST 0 - 40 IU/L 26  24   ALT 0 - 44 IU/L 35  23   Alk Phosphatase 44 - 121 IU/L 52  41   Total Bilirubin 0.0 - 1.2 mg/dL 0.7  1.2        Latest Ref Rng & Units 10/11/2012    5:22 AM 10/11/2012    4:54 AM 10/23/2009    7:00 AM  CBC  WBC 4.0 - 10.5 K/uL  5.1  5.5   Hemoglobin 13.0 - 17.0 g/dL 78.2  95.6  21.3   Hematocrit 39.0 - 52.0 % 47.0  44.7  48.1   Platelets 150 - 400 K/uL  156  208    Lab Results  Component Value Date   MCV 84.2 10/11/2012   MCV 84.4 10/23/2009   No results found for: "TSH" Lab Results  Component Value Date   HGBA1C (H) 10/23/2009    5.9 (NOTE)                                                                       According to the ADA Clinical Practice Recommendations for 2011, when HbA1c is used as a screening test:   >=6.5%   Diagnostic of Diabetes Mellitus           (if abnormal result  is confirmed)  5.7-6.4%   Increased risk of developing Diabetes Mellitus  References:Diagnosis and Classification of Diabetes Mellitus,Diabetes Care,2011,34(Suppl 1):S62-S69 and Standards of Medical Care in         Diabetes - 2011,Diabetes Care,2011,34  (Suppl 1):S11-S61.     BNP No results found for: "BNP"  ProBNP No results found for: "PROBNP"   Lipid Panel     Component Value Date/Time   CHOL 103 01/14/2022 0950   TRIG 72 01/14/2022 0950   HDL 40 01/14/2022 0950   CHOLHDL 2.6 01/14/2022 0950   CHOLHDL 6.6 10/23/2009 0823   VLDL 31 10/23/2009 0823   LDLCALC 48 01/14/2022 0950   LABVLDL 15 01/14/2022 0950     RADIOLOGY: No results found.   Additional  studies/ records that were reviewed today include:  I have reviewed the patient's prior discharge summary from his October 23, 2009 hospitalization.  Records of [name] at [facility] were reviewed as well as his prior nuclear medicine study, echocardiographic evaluation and CT cardiac calcium score.  ASSESSMENT:    1. CAD in native artery   2. Elevated coronary artery calcium score: 1524   3. Hyperlipidemia with target LDL less than 55.   4. OSA on CPAP   5. Grade II diastolic dysfunction   6. Aortic dilatation   7. 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  He was felt possibly to have had a TIA but definitive diagnosis was not established.  At the time he had significant hyperlipidemia with LDL cholesterol at 177.  He also was found to have obstructive sleep apnea and has been on CPAP therapy since 2012.  He denies any significant chest pain.  He underwent a nuclear perfusion study in October 2014 which was normal.  He completed stage IV the Bruce protocol and reached a heart rate of 151 and peak blood pressure 189/74.  In 2019, subsequent echo Doppler study showed normal systolic function and there was evidence for moderate focal basal hypertrophy.  There was grade 2 diastolic dysfunction and there was mild dilatation of his ascending aorta at 40 mm.  Atrial septum was increased in thickness consistent with lipomatous hypertrophy.  He  remained asymptomatic and has been treated with lipid-lowering therapy since his transient neurologic event in 2011.  He underwent CT cardiac scoring per [facility] in 2021 which disclosed significant elevation of his calcium score at 1184 Agatston units with calcification predominantly in the RCA and LAD and mildly in the left circumflex.  Since his retirement in May 2021 he has significantly increased his  level of activity and has been successful with weight loss.  He typically walks at least 2 times per day often for 7 to 10 miles without difficulty.  However recently he had noticed with more rapid movement such as playing pickle ball on a very hot day or doing certain Pilates moves he has experienced some increased shortness of breath.  I reviewed office records from [name].  When I saw him in October 2023 with his LDL cholesterol of 72 on Zetia 10 mg and rosuvastatin 20 mg I recommended more aggressive therapy and titrated rosuvastatin to 40 mg daily with target LDL less than 55.  Coronary CTA on November 18, 2021 revealed calcium score 1524.  The left main was normal.  There was mixed plaque in the proximal LAD with moderate stenosis of 50 to 69%.  Circumflex vessel had 1 to 24% proximal stenosis with 25 to 49% in the AV groove.  The RCA had extensive mixed plaque and there was concern for possible significant stenosis in the distal RCA with positive FFR.  Subsequent laboratory on January 14, 2022 has now showed LDL cholesterol at 48, with total cholesterol 103, triglycerides 72 and HDL 40.  He continues to have normal LV systolic function noted on echocardiography in November 2023 and had grade 2 diastolic dysfunction.  At his evaluation in January 2024 I had an extensive discussion with both he and his wife concerning his coronary CTA findings.  At that time he was not on medical therapy and continues to be completely asymptomatic.  I discussed definitive cardiac catheterization and if significant calcification  is present potential need for atherectomy or shockwave lithotripsy.  At the time, since he remained asymptomatic and was not on medical therapy it was initially recommended to start beta-blocker therapy for potential anti-ischemic benefit and for optimization of blood pressure and heart rate control.  Presently continues to be asymptomatic and walks typically anywhere from 6 to 10 miles per day.  He  denies any change in exercise tolerance.  There is some mild fatigability on the metoprolol.  His ECG today continues to be stable and shows normal sinus rhythm with mild sinus arrhythmia.  I have recommended close follow-up with his continued medical therapy approach and if there is any change in symptomatology definitive catheterization is recommended.   Medication Adjustments/Labs and Tests Ordered: Current medicines are reviewed at length with the patient today.  Concerns regarding medicines are outlined above.  Medication changes, Labs and Tests ordered today are listed in the Patient Instructions below. Patient Instructions  Medication Instructions:  Your physician recommends that you continue on your current medications as directed. Please refer to the Current Medication list given to you today.  *If you need a refill on your cardiac medications before your next appointment, please call your pharmacy*  Follow-Up: At [facility], you and your health needs are our priority.  As part of our continuing mission to provide you with exceptional heart care, we have created designated Provider Care Teams.  These Care Teams include your primary Cardiologist (physician) and Advanced Practice Providers (APPs -  Physician Assistants and Nurse Practitioners) who all work together to provide you with the care you need, when you need it.  We recommend signing up for the patient portal called "MyChart".  Sign up information is provided on this After Visit Summary.  MyChart is used to connect with patients for Virtual Visits (Telemedicine).  Patients are able to view lab/test results, encounter notes, upcoming appointments, etc.  Non-urgent messages can be sent to your provider as well.   To learn more about what you can do with MyChart, go to [email or web address].    Your next appointment:   November with Dr. Tresa Endo        Signed, [name], MD  04/19/2022 1:17 PM    [facility] [address], [place], [place]  [address] Phone: [phone]

## 2022-04-19 ENCOUNTER — Encounter: Payer: Self-pay | Admitting: Cardiovascular Disease

## 2022-07-03 ENCOUNTER — Telehealth: Payer: Self-pay

## 2022-07-03 NOTE — Telephone Encounter (Signed)
Received referral from Dr Clelia Croft at [facility] for OSA eval - needs new CPAP equipment. Placed in sleep mailbox

## 2022-08-17 IMAGING — CT CT CARDIAC CORONARY ARTERY CALCIUM SCORE
2 series · 15 of 20 positions shown, 17 images · non-contrast
Comparison: None.
COMPARISON: None.

Addendum:
EXAM:
OVER-READ INTERPRETATION  CT CHEST

The following report is an over-read performed by radiologist Dr.
Tai Soni [REDACTED] on 04/20/2020. This
over-read does not include interpretation of cardiac or coronary
anatomy or pathology. The coronary calcium score interpretation by
the cardiologist is attached.
CLINICAL DATA: Cardiovascular Disease Risk stratification
Coronary Calcium Score
TECHNIQUE: A gated, non-contrast computed tomography scan of the heart was
performed using 3mm slice thickness. Axial images were analyzed on a
dedicated workstation. Calcium scoring of the coronary arteries was
performed using the Agatston method.

[Series 2: casc 3.0 i36f 2 bestdiast 71 % · axial · 0.42mm/px · z∈[-257,-140]mm · 8 of 51 slices shown, 10 images]
[im 6/51  vessel]
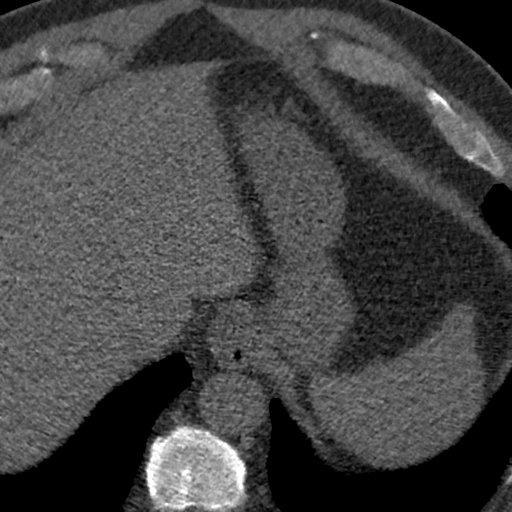
[im 6/51  lung]
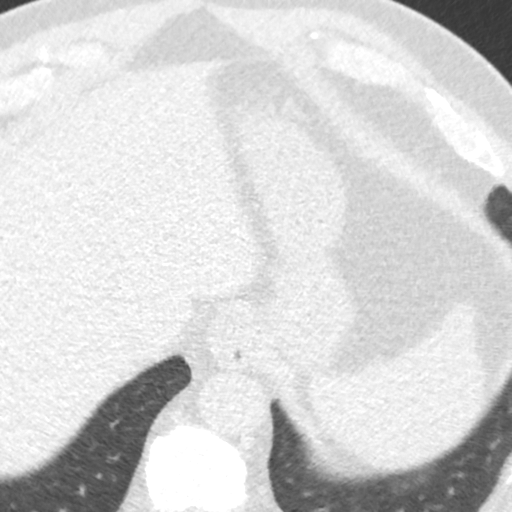
[im 12/51  vessel]
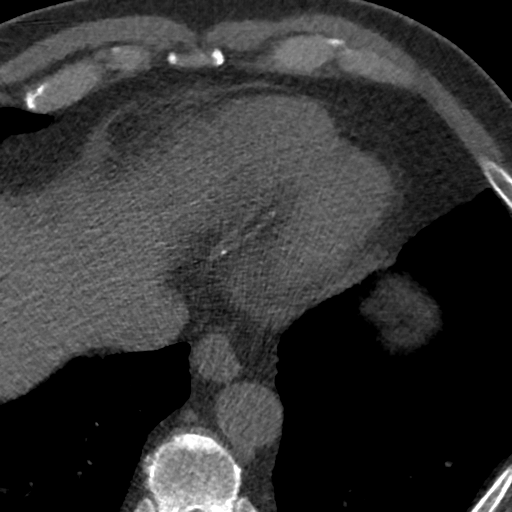
[im 17/51  vessel]
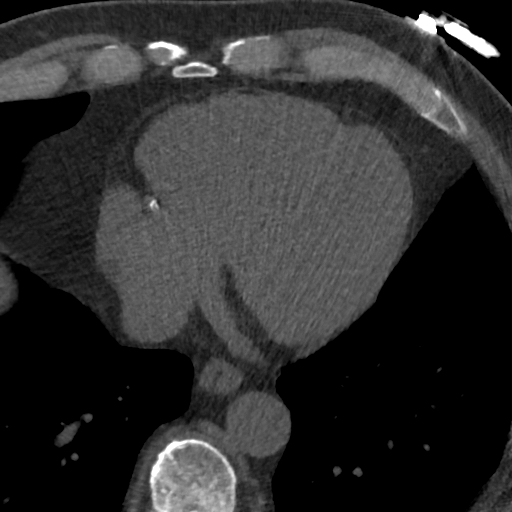
[im 23/51  vessel]
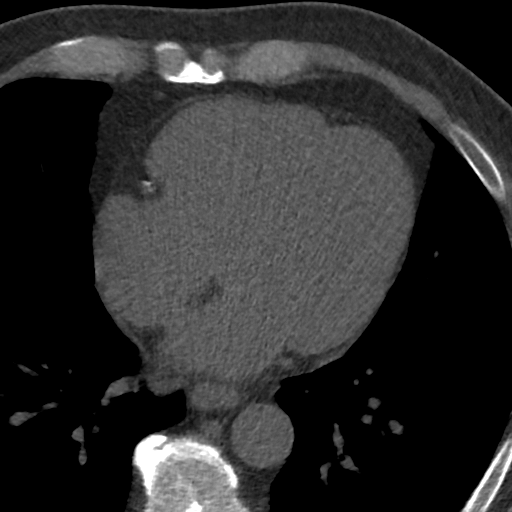
[im 28/51  vessel]
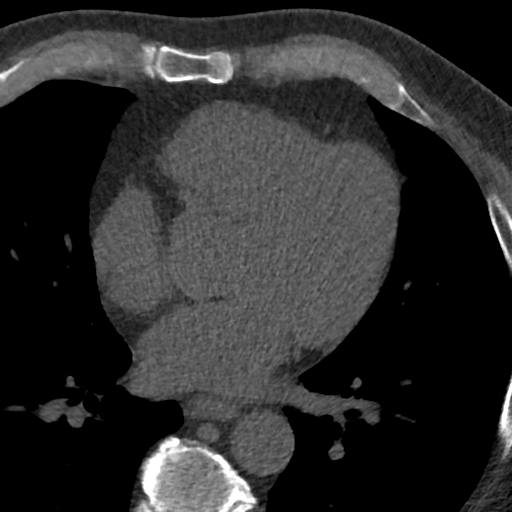
[im 28/51  lung]
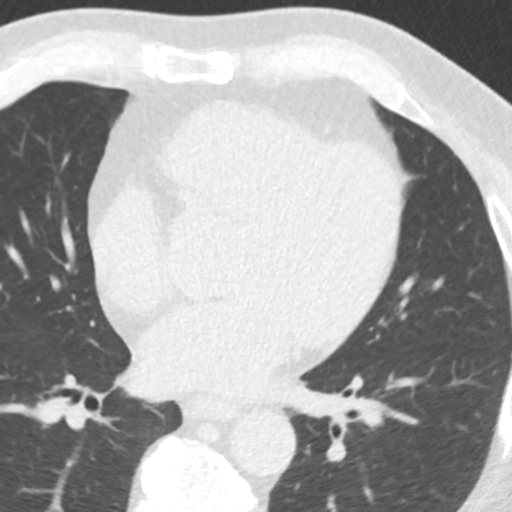
[im 34/51  vessel]
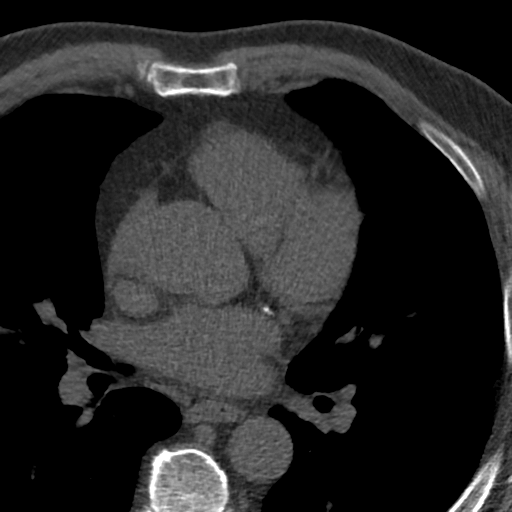
[im 39/51  vessel]
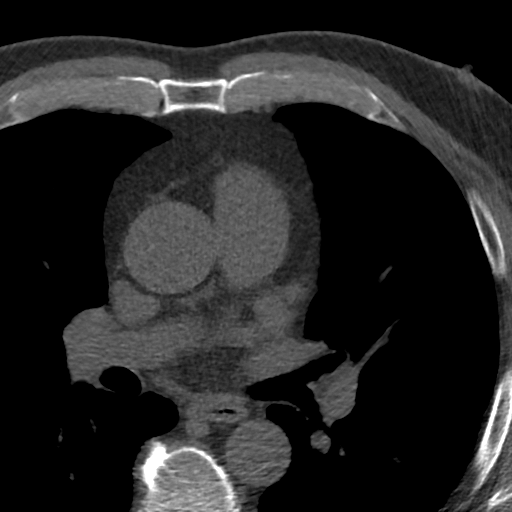
[im 45/51  vessel]
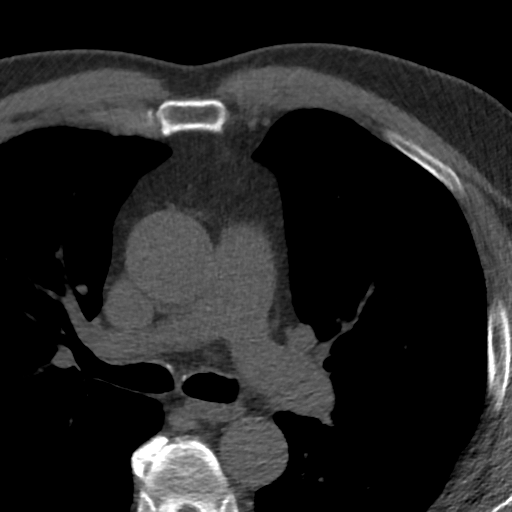

[Series 4: lung st 70 % · axial · 0.83mm/px · z∈[-257,-158]mm · 7 of 51 slices shown]
[im 6/51  lung]
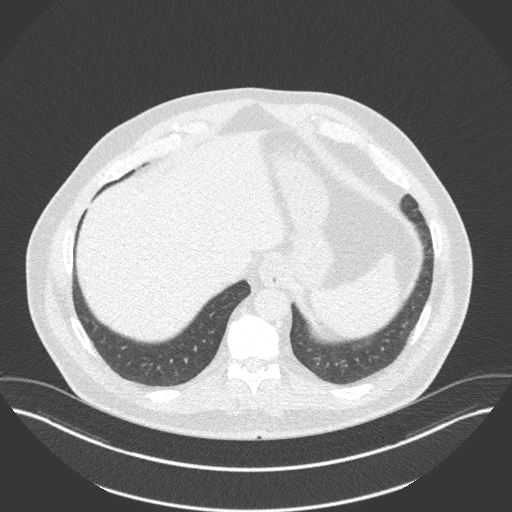
[im 12/51  lung]
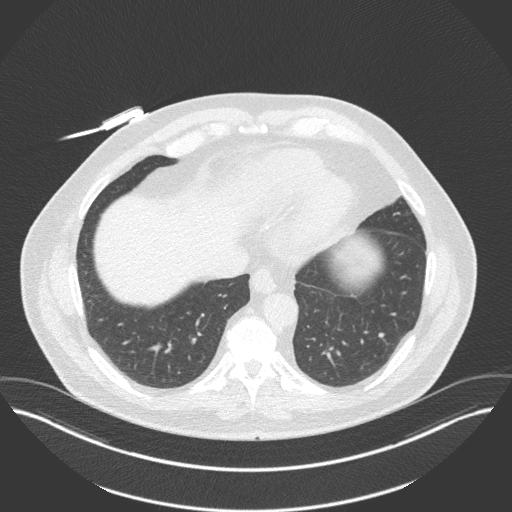
[im 17/51  lung]
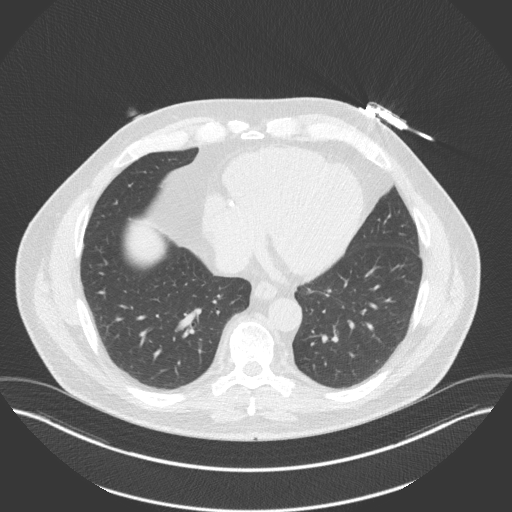
[im 23/51  lung]
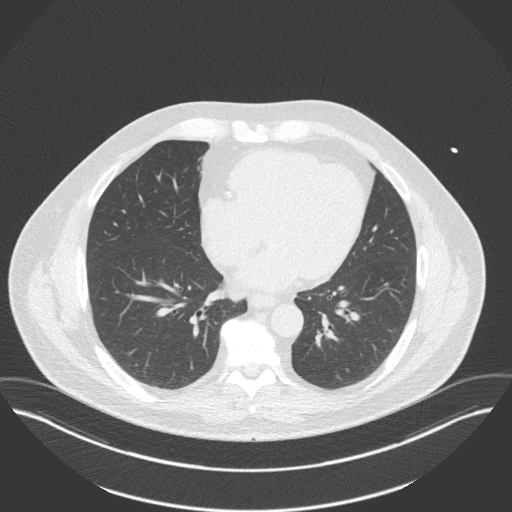
[im 28/51  lung]
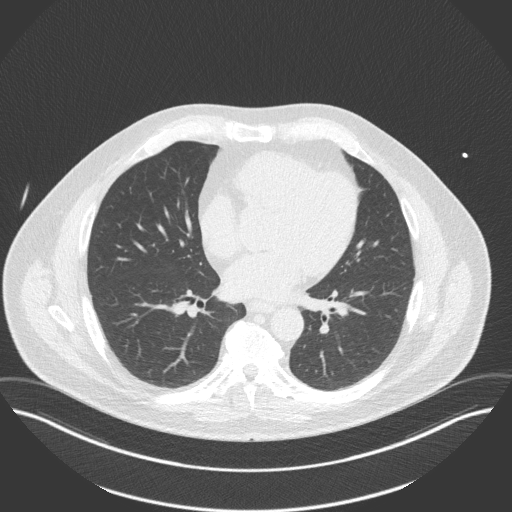
[im 34/51  lung]
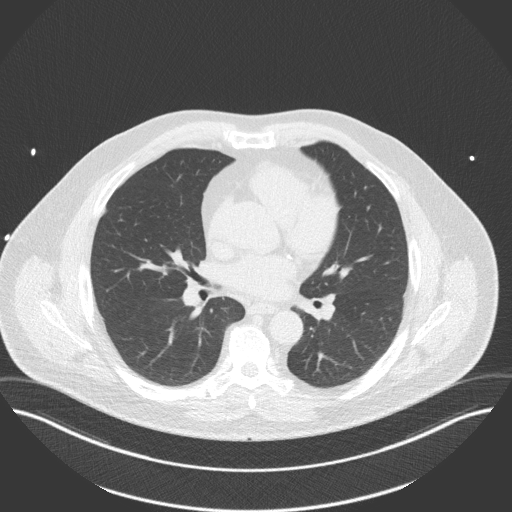
[im 39/51  lung]
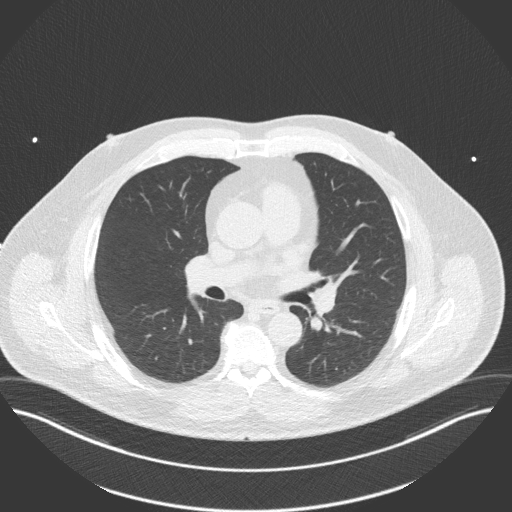

[15 of 20 positions shown; findings below may reference images not displayed]

FINDINGS: Aortic atherosclerosis. Within the visualized portions of the thorax
there are no suspicious appearing pulmonary nodules or masses, there
is no acute consolidative airspace disease, no pleural effusions, no
pneumothorax and no lymphadenopathy. Visualized portions of the
upper abdomen are unremarkable. There are no aggressive appearing
lytic or blastic lesions noted in the visualized portions of the
skeleton.
IMPRESSION: 1.  Aortic Atherosclerosis (1IENA-FXA.A).
FINDINGS: Coronary arteries: Normal origins.

Coronary Calcium Score:

Left main: 0

Left anterior descending artery: 367

Left circumflex artery: 125

Right coronary artery: 693

Total: 6690

Percentile: 95th

Pericardium: Normal.

Ascending Aorta: Normal caliber.

Non-cardiac: See separate report from [REDACTED]. Aortic
atherosclerosis.
IMPRESSION: 1. Coronary calcium score of 6690. This was 95th percentile for
age-, race-, and sex-matched controls. Heavy multivessel coronary
calcification, particularly in the LAD and RCA is noted.
2. Strongly consider further cardiology evaluation



If CAC=0, it is reasonable to withhold statin therapy and reassess
in 5 to 10 years, as long as higher risk conditions are absent
(diabetes mellitus, family history of premature CHD in first degree
relatives (males <55 years; females <65 years), cigarette smoking,
or LDL >=190 mg/dL).

If CAC is 1 to 99, it is reasonable to initiate statin therapy for
patients >=55 years of age.

If CAC is >=100 or >=75th percentile, it is reasonable to initiate
statin therapy at any age.

Cardiology referral should be considered for patients with CAC
scores >=400 or >=75th percentile.

*0151 AHA/ACC/AACVPR/AAPA/ABC/SZIMEISZTER/MESHITO/KASTRIIOT/[place]/CURRAN/HULBERT/FORDAS
Guideline on the Management of Blood Cholesterol: A Report of the
American College of Cardiology/American Heart Association Task Force
on Clinical Practice Guidelines. J Am Coll Cardiol.
5786;73(24):8399-8248.

*** End of Addendum ***
EXAM:
OVER-READ INTERPRETATION  CT CHEST

The following report is an over-read performed by radiologist Dr.
Tai Soni [REDACTED] on 04/20/2020. This
over-read does not include interpretation of cardiac or coronary
anatomy or pathology. The coronary calcium score interpretation by
the cardiologist is attached.
FINDINGS: Aortic atherosclerosis. Within the visualized portions of the thorax
there are no suspicious appearing pulmonary nodules or masses, there
is no acute consolidative airspace disease, no pleural effusions, no
pneumothorax and no lymphadenopathy. Visualized portions of the
upper abdomen are unremarkable. There are no aggressive appearing
lytic or blastic lesions noted in the visualized portions of the
skeleton.
IMPRESSION: 1.  Aortic Atherosclerosis (1IENA-FXA.A).

## 2022-08-18 ENCOUNTER — Institutional Professional Consult (permissible substitution): Payer: PRIVATE HEALTH INSURANCE | Admitting: Neurology

## 2022-08-25 ENCOUNTER — Ambulatory Visit (INDEPENDENT_AMBULATORY_CARE_PROVIDER_SITE_OTHER): Payer: Medicare Other | Admitting: Neurology

## 2022-08-25 ENCOUNTER — Encounter: Payer: Self-pay | Admitting: Neurology

## 2022-08-25 VITALS — BP 114/65 | HR 68 | Ht 68.0 in | Wt 223.0 lb

## 2022-08-25 DIAGNOSIS — G4733 Obstructive sleep apnea (adult) (pediatric): Secondary | ICD-10-CM | POA: Diagnosis not present

## 2022-08-25 DIAGNOSIS — E6609 Other obesity due to excess calories: Secondary | ICD-10-CM | POA: Diagnosis not present

## 2022-08-25 NOTE — Patient Instructions (Signed)
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   2) I will order a HST to see what baseline we now have and replace his machine , he likes a nasal mirage Fx.   3) he will get his travel CPAP set at  95% pressure of the new autotitration device.     I plan to follow up either personally or through our NP within 3-4 months.

## 2022-08-25 NOTE — Progress Notes (Addendum)
SLEEP MEDICINE CLINIC    Provider:  Melvyn Novas, MD  Primary Care Physician:  [name], MD [address] [place] [address]     Referring Provider: [name], Md [address] [place],  [place] [address]          Chief Complaint according to patient   Patient presents with:     New Patient (Initial Visit)      Rm 1, here alone. TIA 2011. Pt is here for Sleep Consult. Pt is currently using CPAP, displaying a message that he needs a new one.         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  Dr [name] has recently seen him after a long hiatus.    8 years ago he started using a travel CPAP, Air mini and he wonders if this needs to be replaced too. Airsense 10 is his main machine.  Chief concern according to patient :  I need a new CPAP , or two- I have no trouble to sleep, to fall asleep and I love my CPAP>    I have the pleasure of seeing Gerod Bohrer 08/25/22 a right-handed male with a OSA sleep disorder, first diagnosed with OSA upon sleep study , referral by [name] in 2012.  The patient had the last sleep study in the year 2016.  His machine is as old - issued in 2016.    Sleep relevant medical history: TIA, obesity, and OSA. Hypothyroidism, hypo-testosterones.   Family medical /sleep history: no other family member on CPAP with OSA.   Social history:  Patient is retired from Neurosurgery and lives in a household with spouse, has 2 married daughters and 2 granddaughters.  Tobacco use: none,  ETOH use ; wine  with dinner 2-3 week.  Caffeine intake in form of Coffee( 1-2 cups in AM ) Soda( one every 2-3 weeks), Tea ( /) no energy drinks Exercise in form of walking pickle ball.   Hobbies :travels, cruising   Sleep habits are as follows: The patient's dinner time is between 5-7 PM. The patient goes to bed at 10.30 PM and  continues to sleep for 7 hours, wakes for one bathroom breaks, the first time at 4-5 AM.  The bedroom is quiet and dark, cool.  The preferred sleep position is laterally, with the support of 1-2 pillows.  Dreams are reportedly infrequent.   The patient wakes up spontaneously, 7-8  AM is the usual rise time. He is feeling refreshed and restored in AM, with no need for naps.    Review of Systems: Out of a complete 14 system review, the patient complains of only the following symptoms, and all other reviewed systems are negative.:    How likely are you to doze in the following situations: 0 = not likely, 1 = slight chance, 2 = moderate chance, 3 = high chance   Sitting and Reading? Watching Television? Sitting inactive in a public place (theater or meeting)? As a passenger in a car for an hour without a break? Lying down in the afternoon when circumstances permit? Sitting and talking to someone? Sitting quietly after lunch without alcohol? In a car, while stopped for a few minutes in traffic?   Total = 2/ 24 points   FSS endorsed at 11/ 63 points.  Social History   Socioeconomic History   Marital status: Married    Spouse name: Not on file   Number of children: Not on file   Years of education: MD   Highest education level: Not on file  Occupational History   Occupation: neurosurgeon  Tobacco Use   Smoking status: Never   Smokeless tobacco: Not on file  Substance and Sexual Activity   Alcohol use: No    Alcohol/week: 0.0 standard drinks of alcohol    Comment: occasional glass of wine 2x weekly   Drug use: Not on file   Sexual activity: Not on file  Other Topics Concern   Not on file  Social History Narrative   Drinks average of 1 cup coffee daily.   Social Determinants of Health   Financial Resource Strain: Not on file  Food Insecurity: Not on file  Transportation Needs: Not on file  Physical Activity: Not on file  Stress: Not on file  Social Connections: Not on  file    Family History  Problem Relation Age of Onset   Stroke Mother    Atrial fibrillation Mother    Lymphoma Father    Pulmonary embolism Brother    Cerebral palsy Brother     Past Medical History:  Diagnosis Date   Hypercholesteremia    Hypertension    OSA on CPAP 05/05/2014   Thyroid disease    TIA (transient ischemic attack)     Past Surgical History:  Procedure Laterality Date   adnoidectomy     VASECTOMY       Current Outpatient Medications on File Prior to Visit  Medication Sig Dispense Refill   aspirin 81 MG tablet Take 81 mg by mouth every morning.      Cholecalciferol (VITAMIN D) 2000 UNITS tablet Take 2,000 Units by mouth every morning.      clopidogrel (PLAVIX) 75 MG tablet Take 75 mg by mouth every morning.      ezetimibe (ZETIA) 10 MG tablet Take 10 mg by mouth every morning.      folic acid (FOLVITE) 800 MCG tablet Take 800 mcg by mouth every morning.      levothyroxine (SYNTHROID) 125 MCG tablet Take 125 mcg by mouth daily before breakfast.     metoprolol succinate (TOPROL XL) 25 MG 24 hr tablet Take 1 tablet (25 mg total) by mouth daily. 90 tablet 3   olmesartan (BENICAR) 20 MG tablet Take 20 mg by mouth every morning.      pantoprazole (PROTONIX) 40 MG tablet Take 40 mg by mouth daily.     rosuvastatin (CRESTOR) 40 MG tablet Take 1 tablet (40 mg total) by mouth every morning. 90 tablet 3   sildenafil (VIAGRA) 100 MG tablet TAKE 1 TABLET EVERY DAY AS NEEDED Oral     Testosterone (ANDROGEL) 20.25 MG/1.25GM (1.62%) GEL Apply 2 pumps daily as directed Transdermal     zaleplon (SONATA) 10 MG capsule Take 10 mg by mouth at bedtime as needed.     nitroGLYCERIN (NITROSTAT) 0.4 MG SL tablet Place 1 tablet (0.4 mg total) under the tongue every 5 (five) minutes as needed. 25 tablet 3   No current facility-administered medications on file prior to visit.    Allergies  Allergen Reactions   Hydrochlorothiazide Itching and Rash    Severe rash; takes 2 dosepaks of  steroids to stop rash.       DIAGNOSTIC DATA (LABS, IMAGING, TESTING) - I reviewed patient records, labs, notes, testing and imaging myself where  available.  Lab Results  Component Value Date   WBC 5.1 10/11/2012   HGB 16.0 10/11/2012   HCT 47.0 10/11/2012   MCV 84.2 10/11/2012   PLT 156 10/11/2012      Component Value Date/Time   NA 140 01/14/2022 0950   K 4.7 01/14/2022 0950   CL 102 01/14/2022 0950   CO2 24 01/14/2022 0950   GLUCOSE 135 (H) 01/14/2022 0950   GLUCOSE 123 (H) 10/11/2012 0522   BUN 15 01/14/2022 0950   CREATININE 1.25 01/14/2022 0950   CALCIUM 9.7 01/14/2022 0950   PROT 7.0 01/14/2022 0950   ALBUMIN 4.6 01/14/2022 0950   AST 26 01/14/2022 0950   ALT 35 01/14/2022 0950   ALKPHOS 52 01/14/2022 0950   BILITOT 0.7 01/14/2022 0950   GFRNONAA >60 10/23/2009 0700   GFRAA  10/23/2009 0700    >60        The eGFR has been calculated using the MDRD equation. This calculation has not been validated in all clinical situations. eGFR's persistently <60 mL/min signify possible Chronic Kidney Disease.   Lab Results  Component Value Date   CHOL 103 01/14/2022   HDL 40 01/14/2022   LDLCALC 48 01/14/2022   TRIG 72 01/14/2022   CHOLHDL 2.6 01/14/2022   Lab Results  Component Value Date   HGBA1C (H) 10/23/2009    5.9 (NOTE)                                                                       According to the ADA Clinical Practice Recommendations for 2011, when HbA1c is used as a screening test:   >=6.5%   Diagnostic of Diabetes Mellitus           (if abnormal result  is confirmed)  5.7-6.4%   Increased risk of developing Diabetes Mellitus  References:Diagnosis and Classification of Diabetes Mellitus,Diabetes Care,2011,34(Suppl 1):S62-S69 and Standards of Medical Care in         Diabetes - 2011,Diabetes Care,2011,34  (Suppl 1):S11-S61.   No results found for: "VITAMINB12" No results found for: "TSH"  PHYSICAL EXAM:  Today's Vitals   08/25/22 1247  BP:  114/65  Pulse: 68  Weight: 223 lb (101.2 kg)  Height: 5\' 8"  (1.727 m)   Body mass index is 33.91 kg/m.   Wt Readings from Last 3 Encounters:  08/25/22 223 lb (101.2 kg)  04/14/22 223 lb 6.4 oz (101.3 kg)  01/29/22 228 lb 3.2 oz (103.5 kg)     Ht Readings from Last 3 Encounters:  08/25/22 5\' 8"  (1.727 m)  04/14/22 5\' 8"  (1.727 m)  01/29/22 5\' 8"  (1.727 m)      General: The patient is awake, alert and appears not in acute distress. The patient is well groomed. Head: Normocephalic, atraumatic. Neck is supple. Mallampati 2,  neck circumference:19 inches . Nasal airflow remains  patent.  Retrognathia is not seen.  Facial hair is present Dental status: intact  Cardiovascular:  Regular rate and cardiac rhythm by pulse,  without distended neck veins. Respiratory: Lungs are clear to auscultation.  Skin:  Without evidence of ankle edema, or rash. Trunk: The patient's posture is erect.   NEUROLOGIC EXAM: The patient is awake and alert, oriented to place and time.  Memory subjective described as intact.  Attention span & concentration ability appears normal.  Speech is fluent,  without  dysarthria, dysphonia or aphasia.  Mood and affect are appropriate.   Cranial nerves: no loss of smell or taste reported  Pupils are equal and briskly reactive to light. Funduscopic exam def.  Extraocular movements in vertical and horizontal planes were intact and without nystagmus. No Diplopia. Visual fields by finger perimetry are intact. Hearing was intact to soft voice and finger rubbing.    Facial sensation intact to fine touch.  Facial motor strength is symmetric and tongue and uvula move midline.  Neck ROM : rotation, tilt and flexion extension were normal for age and shoulder shrug was symmetrical.    Motor exam:  Symmetric bulk, tone and ROM.   Normal tone without cog -wheeling, symmetric grip strength .   Sensory:  Fine touch/ vibration were normal.  Proprioception tested in the upper  extremities was normal.   Coordination: Rapid alternating movements in the fingers/hands were of normal speed.  The Finger-to-nose maneuver was intact without evidence of ataxia, dysmetria or tremor.   Gait and station: Patient could rise unassisted from a seated position, walked without assistive device.   Toe and heel walk were deferred.  Deep tendon reflexes: in the  upper and lower extremities are symmetric and intact.  Babinski response was deferred.    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  In 2011, he developed new onset abnormal sensation in his right face, arm and leg and presented to [facility].  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  An MRI showed ventricular size to be normal and there was a 7 to 11 mm benign cyst in the right basal ganglion.   Cerebral tonsils and foramen magnum were within normal limits.   He was evaluated by [name] and was felt possibly at that time to have suffered a left brainstem or subcortical transient ischemic activity, but ultimately according to 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  At the time, he was started on Plavix as well as lipid-lowering therapy with Crestor after his lipid panel showed total cholesterol 245, triglycerides 157, HDL 37, LDL 177.  He was subsequently evaluated by me but I do not have records of my office note from the [facility].  He had undergone an echo Doppler study at that time and also underwent advanced lipid testing with [facility] heart lab on October 31, 2009.  ApoB was 103.  C-reactive protein was intermediary at 2.5 mg/L.  Reportedly LP(a) was normal.   He was subsequently referred to Dr. Porfirio Mylar Janaiyah Blackard and has been on CPAP therapy for obstructive sleep apnea.     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 He is considered CPAP dependent, Risk factors for OSA are upper airway anatomy and BMI, neck size.   2) I will order a HST to see what baseline we now have and will replace his machine if indicated, he likes a nasal mirage Fx.   3) he will get his travel CPAP set at 95% pressure of the new autotitration device.     I plan to follow up either personally or through our NP within 3-4 months.   I would like  to thank [name], MD and Dr Tresa Endo for allowing me  to take care of this pleasant patient and former colleague.    After spending a total time of  35  minutes face to face and additional time for physical and neurologic examination, review of laboratory studies,  personal review of imaging studies, reports and results of other testing and review of referral information / records as far as provided in visit,   Electronically signed by: Melvyn Novas, MD 08/25/2022 1:07 PM  [facility] Board certified by The [employer] of Sleep Medicine and Diplomate of the [employer] of Sleep Medicine. Board certified In Neurology through the ABPN, Fellow of the [employer] of Neurology.

## 2022-09-09 ENCOUNTER — Ambulatory Visit (INDEPENDENT_AMBULATORY_CARE_PROVIDER_SITE_OTHER): Payer: Medicare Other | Admitting: Neurology

## 2022-09-09 DIAGNOSIS — G4733 Obstructive sleep apnea (adult) (pediatric): Secondary | ICD-10-CM

## 2022-09-09 DIAGNOSIS — E6609 Other obesity due to excess calories: Secondary | ICD-10-CM

## 2022-09-09 DIAGNOSIS — Z9989 Dependence on other enabling machines and devices: Secondary | ICD-10-CM

## 2022-09-10 NOTE — Progress Notes (Signed)
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  Dr [name] has recently seen him after a long hiatus. 8 years ago he started using a travel CPAP, Air mini, and he wonders if this needs to be replaced too. Airsense 10 is his main machine.  "I need a new CPAP , or two- I have no trouble to sleep, to fall asleep and I love my CPAP".   Epworth sleepiness score: 2/24. FSS 11/ 63 points.    BMI: 34 kg/m   Neck Circumference: 19"   FINDINGS:   Sleep Summary:   Total Recording Time (hours, min):    9 h 2 m    Total Sleep Time (hours, min):  7 h 3 m               Percent REM (%):  14.2                                       Respiratory Indices:   Calculated pAHI (per hour):   16.5 /h                          REM pAHI:   26.1/h                                              NREM pAHI:   14.8                            Positional  AHI:   supine AHI 60/h !  Snoring : Mild -with a mean Volume of 41 dB                                                  Oxygen Saturation Statistics:   O2 Saturation Range (%):    Between a nadir at 86% and a maximum saturation of 99% with a mean saturation of 95%                                   O2 Saturation (minutes) <89%:    0.7 m       Pulse Rate Statistics:   Pulse Mean (bpm): 57 bpm               Pulse Range: Between 42 bpm and 79 bpm.              IMPRESSION:  This HST confirms the presence of presence of moderate OSA with REM SLEEP ACCENTUATION  AND  DEPENDENCY ON SUPINE SLEEP POSITION.    RECOMMENDATION: Continuation of CPAP therapy is recommended. A new autotitration CPAP device will be ordered ,  the setting will be 5- 16 cm water pressure,  2 cm EPR, heated humidification and mask of his choice.  The future setting of his travel CPAP will be at 95% of  auto titration device.    INTERPRETING PHYSICIAN: Melvyn Novas, MD

## 2022-09-15 NOTE — Procedures (Signed)
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  Dr [name] has recently seen him after a long hiatus. 8 years ago he started using a travel CPAP, Air mini, and he wonders if this needs to be replaced too. Airsense 10 is his main machine.  "I need a new CPAP , or two- I have no trouble to sleep, to fall asleep and I love my CPAP".   Epworth sleepiness score: 2/24. FSS 11/ 63 points.    BMI: 34 kg/m   Neck Circumference: 19"   FINDINGS:   Sleep Summary:   Total Recording Time (hours, min):    9 h 2 m    Total Sleep Time (hours, min):  7 h 3 m               Percent REM (%):  14.2                                       Respiratory Indices:   Calculated pAHI (per hour):   16.5 /h                          REM pAHI:   26.1/h                                              NREM pAHI:   14.8                            Positional  AHI:   supine AHI 60/h !  Snoring : Mild -with a mean Volume of 41 dB                                                  Oxygen Saturation Statistics:   O2 Saturation Range (%):    Between a nadir at 86% and a maximum saturation of 99% with a mean saturation of 95%                                   O2 Saturation (minutes) <89%:    0.7 m       Pulse Rate Statistics:   Pulse Mean (bpm): 57 bpm               Pulse Range: Between 42 bpm and 79 bpm.              IMPRESSION:  This HST confirms the presence of presence of moderate OSA with REM SLEEP ACCENTUATION  AND  DEPENDENCY ON SUPINE SLEEP POSITION.    RECOMMENDATION: Continuation of CPAP therapy is recommended. A new autotitration CPAP device will be ordered ,  the setting will be 5- 16 cm water pressure,  2 cm EPR, heated humidification and mask of his choice.  The future setting of his travel CPAP will be at 95% of  auto titration device.    INTERPRETING PHYSICIAN: Melvyn Novas, MD

## 2022-09-16 ENCOUNTER — Encounter: Payer: Self-pay | Admitting: Neurology

## 2022-09-23 ENCOUNTER — Other Ambulatory Visit: Payer: Self-pay | Admitting: Cardiovascular Disease

## 2022-09-23 NOTE — Telephone Encounter (Signed)
Called the patient and discussed the sleep study results Pt is agreeable to starting a CPAP. I advised pt that an order will be sent to a DME, [facility], and [facility] will call the pt within about one week after they file with the pt's insurance. [facility] will show the pt how to use the machine, fit for masks, and troubleshoot the CPAP if needed. A follow up appt was made for insurance purposes with Dr. Vickey Huger on 12/24/22 at 10:30 am. Pt verbalized understanding to arrive 15 minutes early and bring their CPAP. Pt verbalized understanding of results. Pt had no questions at this time but was encouraged to call back if questions arise. I have sent the order to [facility] and have received confirmation that they have received the order.

## 2022-10-16 ENCOUNTER — Encounter: Payer: Self-pay | Admitting: *Deleted

## 2022-10-16 NOTE — Progress Notes (Signed)
PATIENT NAVIGATOR PROGRESS NOTE  Name: Darren Hull Date: 10/16/2022 MRN: [id number]  DOB: 1955-11-02   Reason for visit:  New patient appt  Comments:  Scheduled New Patient appt with Dr Truett Perna on 10/31/22 at 1:40pm    Time spent counseling/coordinating care: 30-45 minutes

## 2022-10-24 ENCOUNTER — Inpatient Hospital Stay: Payer: Medicare Other | Attending: Oncology | Admitting: Oncology

## 2022-10-24 ENCOUNTER — Other Ambulatory Visit: Payer: Self-pay | Admitting: *Deleted

## 2022-10-24 ENCOUNTER — Inpatient Hospital Stay: Payer: Medicare Other

## 2022-10-24 ENCOUNTER — Encounter: Payer: Self-pay | Admitting: *Deleted

## 2022-10-24 ENCOUNTER — Encounter: Payer: Self-pay | Admitting: Oncology

## 2022-10-24 VITALS — BP 141/87 | HR 66 | Temp 97.7°F | Resp 16 | Wt 226.6 lb

## 2022-10-24 DIAGNOSIS — Z8041 Family history of malignant neoplasm of ovary: Secondary | ICD-10-CM | POA: Diagnosis not present

## 2022-10-24 DIAGNOSIS — C8594 Non-Hodgkin lymphoma, unspecified, lymph nodes of axilla and upper limb: Secondary | ICD-10-CM

## 2022-10-24 DIAGNOSIS — C84A0 Cutaneous T-cell lymphoma, unspecified, unspecified site: Secondary | ICD-10-CM

## 2022-10-24 DIAGNOSIS — R2232 Localized swelling, mass and lump, left upper limb: Secondary | ICD-10-CM | POA: Insufficient documentation

## 2022-10-24 DIAGNOSIS — Z807 Family history of other malignant neoplasms of lymphoid, hematopoietic and related tissues: Secondary | ICD-10-CM | POA: Diagnosis not present

## 2022-10-24 DIAGNOSIS — C8519 Unspecified B-cell lymphoma, extranodal and solid organ sites: Secondary | ICD-10-CM

## 2022-10-24 DIAGNOSIS — Z8673 Personal history of transient ischemic attack (TIA), and cerebral infarction without residual deficits: Secondary | ICD-10-CM | POA: Insufficient documentation

## 2022-10-24 DIAGNOSIS — G4733 Obstructive sleep apnea (adult) (pediatric): Secondary | ICD-10-CM | POA: Insufficient documentation

## 2022-10-24 DIAGNOSIS — E291 Testicular hypofunction: Secondary | ICD-10-CM

## 2022-10-24 DIAGNOSIS — E039 Hypothyroidism, unspecified: Secondary | ICD-10-CM | POA: Diagnosis not present

## 2022-10-24 DIAGNOSIS — K219 Gastro-esophageal reflux disease without esophagitis: Secondary | ICD-10-CM | POA: Diagnosis not present

## 2022-10-24 DIAGNOSIS — C8590 Non-Hodgkin lymphoma, unspecified, unspecified site: Secondary | ICD-10-CM

## 2022-10-24 DIAGNOSIS — G4730 Sleep apnea, unspecified: Secondary | ICD-10-CM | POA: Diagnosis not present

## 2022-10-24 DIAGNOSIS — I10 Essential (primary) hypertension: Secondary | ICD-10-CM | POA: Insufficient documentation

## 2022-10-24 DIAGNOSIS — I2510 Atherosclerotic heart disease of native coronary artery without angina pectoris: Secondary | ICD-10-CM | POA: Insufficient documentation

## 2022-10-24 LAB — CMP (CANCER CENTER ONLY)
ALT: 34 U/L (ref 0–44)
AST: 31 U/L (ref 15–41)
Albumin: 4.7 g/dL (ref 3.5–5.0)
Alkaline Phosphatase: 46 U/L (ref 38–126)
Anion gap: 6 (ref 5–15)
BUN: 18 mg/dL (ref 8–23)
CO2: 28 mmol/L (ref 22–32)
Calcium: 10.2 mg/dL (ref 8.9–10.3)
Chloride: 101 mmol/L (ref 98–111)
Creatinine: 1.29 mg/dL — ABNORMAL HIGH (ref 0.61–1.24)
GFR, Estimated: >60 mL/min (ref >60)
Glucose, Bld: 246 mg/dL — ABNORMAL HIGH (ref 70–99)
Potassium: 4.3 mmol/L (ref 3.5–5.1)
Sodium: 135 mmol/L (ref 135–145)
Total Bilirubin: 0.9 mg/dL (ref 0.3–1.2)
Total Protein: 7.5 g/dL (ref 6.5–8.1)

## 2022-10-24 LAB — CBC WITH DIFFERENTIAL (CANCER CENTER ONLY)
Abs Immature Granulocytes: 0.02 10*3/uL (ref 0.00–0.07)
Basophils Absolute: 0 10*3/uL (ref 0.0–0.1)
Basophils Relative: 1 %
Eosinophils Absolute: 0.2 10*3/uL (ref 0.0–0.5)
Eosinophils Relative: 3 %
HCT: 46.8 % (ref 39.0–52.0)
Hemoglobin: 16.4 g/dL (ref 13.0–17.0)
Immature Granulocytes: 0 %
Lymphocytes Relative: 26 %
Lymphs Abs: 1.4 10*3/uL (ref 0.7–4.0)
MCH: 29 pg (ref 26.0–34.0)
MCHC: 35 g/dL (ref 30.0–36.0)
MCV: 82.8 fL (ref 80.0–100.0)
Monocytes Absolute: 0.4 10*3/uL (ref 0.1–1.0)
Monocytes Relative: 7 %
Neutro Abs: 3.5 10*3/uL (ref 1.7–7.7)
Neutrophils Relative %: 63 %
Platelet Count: 163 10*3/uL (ref 150–400)
RBC: 5.65 MIL/uL (ref 4.22–5.81)
RDW: 13.1 % (ref 11.5–15.5)
WBC Count: 5.5 10*3/uL (ref 4.0–10.5)
nRBC: 0 % (ref 0.0–0.2)

## 2022-10-24 LAB — LACTATE DEHYDROGENASE: LDH: 171 U/L (ref 98–192)

## 2022-10-24 NOTE — Progress Notes (Signed)
[facility] New Patient Consult   Requesting MD: [name], Md [address] [place],  [place] [address]   Darren Hull 67 y.o.  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  He has also noted a similar lesion at the mid upper back.  He saw Dr. Yetta Barre and underwent a punch biopsy of the left deltoid lesion 09/17/2022 and the midline superior upper back lesion on 10/02/2022.  The pathology on both lesions revealed an atypical lymphoid infiltrate.  B-cell gene rearrangement study on the left shoulder biopsy specimen revealed a clonal kappa restricted population favoring a B-cell lymphoproliferative process.  He reports no other skin lesions.  He feels well.  Past Medical History:  Diagnosis Date   Hypercholesteremia    Hypertension    OSA on CPAP 05/05/2014   Thyroid disease    TIA (transient ischemic attack) 2011    .-Esophageal reflux disease   .  CAD, high coronary calcium score  Past Surgical History:  Procedure Laterality Date   adnoidectomy     VASECTOMY      Medications: Reviewed  Allergies:  Allergies  Allergen Reactions   Hydrochlorothiazide Itching and Rash    Severe rash; takes 2 dosepaks of steroids to stop rash.      Family history: His father died of non-Hodgkin's lymphoma approximately 15 months after diagnosis.  A paternal aunt had ovarian cancer.  His brother had cerebral palsy and died of a pulmonary embolism at age 66  Social History:   He lives with his wife in [place].  They have 2 daughters.  He is a retired Midwife.  He does not use cigarettes.  Social alcohol use, drinks a few glasses of wine per week.  No risk factor for HIV or hepatitis.  ROS:   Positives include: Intermittent night sweats for the past 12-15 years, sweats prior to a bowel movement, nodular lesion at the left upper arm and mid upper back,  intermittent solid dysphagia-resolved after taking pantoprazole  A complete ROS was otherwise negative.  Physical Exam:  Blood pressure (!) 141/87, pulse 66, temperature 97.7 F (36.5 C), temperature source Tympanic, resp. rate 16, weight 226 lb 9.6 oz (102.8 kg), SpO2 100%.  HEENT: Oropharynx without visible mass, neck without mass Lungs: Clear bilaterally Cardiac: Regular rate and rhythm Abdomen: No hepatosplenomegaly GU: Testes without mass Vascular: No leg edema Lymph nodes: No cervical, supraclavicular, axillary, or inguinal nodes Neurologic: Alert and oriented, the motor exam appears intact in the upper lower extremities bilaterally Skin: 1.5 cm pink/tan slightly raised lesions at the left upper arm and mid upper back.  No other nodular skin lesions multiple hyperpigmented and cherry moles over the trunk    LAB:  CBC  Lab Results  Component Value Date   WBC 5.5 10/24/2022   HGB 16.4 10/24/2022   HCT 46.8 10/24/2022   MCV 82.8 10/24/2022   PLT 163 10/24/2022   NEUTROABS 3.5 10/24/2022        CMP  Lab Results  Component Value Date   NA 140 01/14/2022   K 4.7 01/14/2022   CL 102 01/14/2022   CO2 24 01/14/2022   GLUCOSE 135 (H) 01/14/2022   BUN 15 01/14/2022   CREATININE 1.25 01/14/2022   CALCIUM 9.7 01/14/2022   PROT 7.0 01/14/2022   ALBUMIN 4.6 01/14/2022   AST 26 01/14/2022   ALT  35 01/14/2022   ALKPHOS 52 01/14/2022   BILITOT 0.7 01/14/2022   GFRNONAA >60 10/23/2009   GFRAA  10/23/2009    >60        The eGFR has been calculated using the MDRD equation. This calculation has not been validated in all clinical situations. eGFR's persistently <60 mL/min signify possible Chronic Kidney Disease.        Assessment/Plan:   Atypical lymphoid infiltrate suspicious for a B-cell lymphoproliferative disorder involving biopsies of  left upper arm and mid upper back skin lesions Left lateral shoulder biopsy 09/17/2022-B-cell component in both nodular  and reticular areas,, germinal center areas display positive for CD10, BCL6, and Bcl-2, CD30 positive in larger lymphoid cells, CD138 highlights a minor plasma cell component with lambda light chain excess, no coexpression of CD5 in B-cell areas, B-cell gene rearrangement-clonal immunoglobulin kappa light chain gene rearrangement Midline superior upper back biopsy 10/02/2022-mixture of T and B cells with slight predominance of T cells, BCL6 highlights germinal centers, Bcl-2 negative.  No coexpression of CD5 in B-cell areas.  No CD10 or cyclin D1 positivity.  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  Biopsies of both lesions are suspicious for a B-cell lymphoproliferative disorder.  Gene rearrangement on the left arm biopsy returned positive for a clonal kappa light chain gene rearrangement.  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  He is asymptomatic.  There is no clinical evidence of systemic disease.  We will obtain a staging evaluation to include a PET scan.  He will return for an office visit and further discussion next week.  We will consider observation versus referral for primary radiation.  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  He will be referred to the genetics counselor.  Thornton Papas, MD  10/24/2022, 11:29 AM

## 2022-10-24 NOTE — Progress Notes (Signed)
PATIENT NAVIGATOR PROGRESS NOTE  Name: Darren Hull Date: 10/24/2022 MRN: [id number]  DOB: 1955/06/30   Reason for visit:  New Patient appt  Comments:  Met Dr and Mrs Beckstrand during visit with Dr Truett Perna  PET scan scheduled for 10/23 and written instructions written and reviewed Gave contact number to call with any questions or issues Genetics referral entered     Time spent counseling/coordinating care: > 60 minutes

## 2022-10-24 NOTE — Progress Notes (Signed)
PET staging ordered per Dr Truett Perna

## 2022-10-26 LAB — BETA 2 MICROGLOBULIN, SERUM: Beta-2 Microglobulin: 1.6 mg/L (ref 0.6–2.4)

## 2022-10-28 LAB — MULTIPLE MYELOMA PANEL, SERUM
Albumin SerPl Elph-Mcnc: 4.3 g/dL (ref 2.9–4.4)
Albumin/Glob SerPl: 1.5 (ref 0.7–1.7)
Alpha 1: 0.2 g/dL (ref 0.0–0.4)
Alpha2 Glob SerPl Elph-Mcnc: 0.5 g/dL (ref 0.4–1.0)
B-Globulin SerPl Elph-Mcnc: 0.9 g/dL (ref 0.7–1.3)
Gamma Glob SerPl Elph-Mcnc: 1.4 g/dL (ref 0.4–1.8)
Globulin, Total: 3 g/dL (ref 2.2–3.9)
IgA: 47 mg/dL — ABNORMAL LOW (ref 61–437)
IgG (Immunoglobin G), Serum: 1478 mg/dL (ref 603–1613)
IgM (Immunoglobulin M), Srm: 78 mg/dL (ref 20–172)
Total Protein ELP: 7.3 g/dL (ref 6.0–8.5)

## 2022-10-29 ENCOUNTER — Encounter (HOSPITAL_COMMUNITY)
Admission: RE | Admit: 2022-10-29 | Discharge: 2022-10-29 | Disposition: A | Discharge status: Home / Self Care | Payer: Medicare Other | Source: Ambulatory Visit | Attending: Oncology | Admitting: Oncology

## 2022-10-29 ENCOUNTER — Telehealth: Payer: Self-pay | Admitting: Oncology

## 2022-10-29 DIAGNOSIS — C8590 Non-Hodgkin lymphoma, unspecified, unspecified site: Secondary | ICD-10-CM | POA: Diagnosis present

## 2022-10-29 LAB — GLUCOSE, CAPILLARY: Glucose-Capillary: 119 mg/dL — ABNORMAL HIGH (ref 70–99)

## 2022-10-29 MED ORDER — FLUDEOXYGLUCOSE F - 18 (FDG) INJECTION
11.3000 | Freq: Once | INTRAVENOUS | Status: AC
  Administered 2022-10-29: 12.05 via INTRAVENOUS

## 2022-10-31 ENCOUNTER — Inpatient Hospital Stay: Payer: BLUE CROSS/BLUE SHIELD | Admitting: Oncology

## 2022-10-31 ENCOUNTER — Inpatient Hospital Stay (HOSPITAL_BASED_OUTPATIENT_CLINIC_OR_DEPARTMENT_OTHER): Payer: Medicare Other | Admitting: Oncology

## 2022-10-31 ENCOUNTER — Other Ambulatory Visit: Payer: BLUE CROSS/BLUE SHIELD

## 2022-10-31 VITALS — BP 122/64 | HR 64 | Temp 98.2°F | Resp 16 | Wt 223.4 lb

## 2022-10-31 DIAGNOSIS — C8519 Unspecified B-cell lymphoma, extranodal and solid organ sites: Secondary | ICD-10-CM | POA: Diagnosis not present

## 2022-10-31 DIAGNOSIS — R2232 Localized swelling, mass and lump, left upper limb: Secondary | ICD-10-CM | POA: Diagnosis not present

## 2022-10-31 NOTE — Progress Notes (Signed)
[facility] OFFICE PROGRESS NOTE   Diagnosis: Cutaneous B-cell lymphoma  INTERVAL HISTORY:   Darren Hull returns as scheduled.  No new complaint.  He underwent a staging PET scan 10/29/2022.  Objective:  Vital signs in last 24 hours:  Blood pressure 122/64, pulse 64, temperature 98.2 F (36.8 C), temperature source Temporal, resp. rate 16, weight 223 lb 6.4 oz (101.3 kg), SpO2 98%.      Skin: Slightly raised tan lesion at the mid upper back measuring 1.5 (transverse) by 1 cm, tan lesion of the left upper arm measures 1.5 (transverse) by 1 cm  Portacath/PICC-without erythema  Lab Results:  Lab Results  Component Value Date   WBC 5.5 10/24/2022   HGB 16.4 10/24/2022   HCT 46.8 10/24/2022   MCV 82.8 10/24/2022   PLT 163 10/24/2022   NEUTROABS 3.5 10/24/2022    CMP  Lab Results  Component Value Date   NA 135 10/24/2022   K 4.3 10/24/2022   CL 101 10/24/2022   CO2 28 10/24/2022   GLUCOSE 246 (H) 10/24/2022   BUN 18 10/24/2022   CREATININE 1.29 (H) 10/24/2022   CALCIUM 10.2 10/24/2022   PROT 7.5 10/24/2022   ALBUMIN 4.7 10/24/2022   AST 31 10/24/2022   ALT 34 10/24/2022   ALKPHOS 46 10/24/2022   BILITOT 0.9 10/24/2022   GFRNONAA >60 10/24/2022   GFRAA  10/23/2009    >60        The eGFR has been calculated using the MDRD equation. This calculation has not been validated in all clinical situations. eGFR's persistently <60 mL/min signify possible Chronic Kidney Disease.   06/03/2022: PSA-0.762 NM PET Image Initial (PI) Skull Base To Thigh  Result Date: 10/30/2022 CLINICAL DATA:  Initial treatment strategy for suspected low-grade cutaneous B-cell lymphoma involving biopsies left upper arm and mid upper back cutaneous lesions. EXAM: NUCLEAR MEDICINE PET SKULL BASE TO THIGH TECHNIQUE: 12.1 mCi F-18 FDG was injected intravenously. Full-ring PET imaging was performed from the skull base to thigh after the radiotracer. CT data was obtained and used  for attenuation correction and anatomic localization. Fasting blood glucose: 119 mg/dl COMPARISON:  Overlapping portions cardiac CT 11/18/2021 FINDINGS: Mediastinal blood pool activity: SUV max 2.5 Liver activity: SUV max 3.3 NECK: No significant abnormal hypermetabolic activity in this region. Incidental CT findings: Small mucous retention cyst in the right maxillary sinus. Mild atheromatous vascular calcifications in the bilateral common carotid arteries (image 32, series 4). CHEST: No significant abnormal hypermetabolic activity in this region. Incidental CT findings: Mild atheromatous vascular calcification the left anterior descending coronary artery and descending thoracic aorta. Mild cardiomegaly. ABDOMEN/PELVIS: Low-grade accentuated activity along the left posteromedial and posterolateral prostate base, nonspecific, but if the patient has abnormal PSA level then further workup may be prompted. Maximum SUV in this vicinity is only about 3.0. Incidental CT findings: Normal spleen. Mild atheromatous vascular disease primarily involving the abdominal aorta and iliac arteries, with some lesser involvement in the left renal vein and proximal celiac trunk. SKELETON: No significant abnormal hypermetabolic activity in this region. Incidental CT findings: None. IMPRESSION: 1. No findings of active lymphoma. The cutaneous lesions that prompted this exam are not readily visible. The spleen appears normal. 2. Low-grade activity along the left posteromedial and posterolateral prostate base, nonspecific, but consider obtaining a PSA level if not recently performed for prostate screening. 3. Mild cardiomegaly. 4. Atheromatous vascular disease, including the abdominal aorta and left anterior descending coronary artery. 5. Small mucous retention cyst in the  right maxillary sinus. Aortic Atherosclerosis (ICD10-I70.0). Electronically Signed   By: Gaylyn Rong M.D.   On: 10/30/2022 16:31    Medications: I have reviewed  the patient's current medications.   Assessment/Plan: Atypical lymphoid infiltrate suspicious for a B-cell lymphoproliferative disorder involving biopsies of  left upper arm and mid upper back skin lesions Left lateral shoulder biopsy 09/17/2022-B-cell component in both nodular and reticular areas,, germinal center areas display positive for CD10, BCL6, and Bcl-2, CD30 positive in larger lymphoid cells, CD138 highlights a minor plasma cell component with lambda light chain excess, no coexpression of CD5 in B-cell areas, B-cell gene rearrangement-clonal immunoglobulin kappa light chain gene rearrangement Midline superior upper back biopsy 10/02/2022-mixture of T and B cells with slight predominance of T cells, BCL6 highlights germinal centers, Bcl-2 negative.  No coexpression of CD5 in B-cell areas.  No CD10 or cyclin D1 positivity.  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  There are 2 measurable skin lesions.  He is asymptomatic.  There is no systemic evidence of lymphoma.  I reviewed the pathology results with Darren Hull.  He feels the biopsies represent cutaneous lymphoma and favors a marginal zone lymphoma.  We can consider an excisional biopsy to obtain a more definitive lymphoma classification if/when the lesions enlarge.  I discussed treatment options with Darren Hull including observation and treatment directed at the 2 cutaneous lesions.  We discussed excision,  radiation, and intralesional triamcinolone.  He understands the likelihood of developing recurrent disease with the above treatments.  He is comfortable with observation.  He will return for an office visit in approximately 3 months.  He will call if the lesions enlarge or he develops new symptoms.  The prostate finding on the PET is likely nonspecific.  He had a normal PSA in May and will continue PSA follow-up with [name].  Thornton Papas, MD  10/31/2022  1:46 PM

## 2022-11-06 ENCOUNTER — Encounter: Payer: Self-pay | Admitting: Dermatology

## 2022-11-21 ENCOUNTER — Ambulatory Visit: Payer: Medicare Other | Attending: Cardiovascular Disease | Admitting: Cardiovascular Disease

## 2022-11-21 DIAGNOSIS — I2510 Atherosclerotic heart disease of native coronary artery without angina pectoris: Secondary | ICD-10-CM

## 2022-11-21 DIAGNOSIS — I10 Essential (primary) hypertension: Secondary | ICD-10-CM | POA: Diagnosis not present

## 2022-11-21 DIAGNOSIS — Z136 Encounter for screening for cardiovascular disorders: Secondary | ICD-10-CM | POA: Diagnosis present

## 2022-11-21 DIAGNOSIS — E669 Obesity, unspecified: Secondary | ICD-10-CM

## 2022-11-21 DIAGNOSIS — I444 Left anterior fascicular block: Secondary | ICD-10-CM

## 2022-11-21 DIAGNOSIS — R931 Abnormal findings on diagnostic imaging of heart and coronary circulation: Secondary | ICD-10-CM

## 2022-11-21 DIAGNOSIS — G4733 Obstructive sleep apnea (adult) (pediatric): Secondary | ICD-10-CM

## 2022-11-21 DIAGNOSIS — I77819 Aortic ectasia, unspecified site: Secondary | ICD-10-CM

## 2022-11-21 DIAGNOSIS — R001 Bradycardia, unspecified: Secondary | ICD-10-CM | POA: Diagnosis not present

## 2022-11-21 DIAGNOSIS — E039 Hypothyroidism, unspecified: Secondary | ICD-10-CM

## 2022-11-21 DIAGNOSIS — I5189 Other ill-defined heart diseases: Secondary | ICD-10-CM

## 2022-11-21 NOTE — Patient Instructions (Signed)
Medication Instructions:  No changes *If you need a refill on your cardiac medications before your next appointment, please call your pharmacy*  Follow-Up: At [facility], you and your health needs are our priority.  As part of our continuing mission to provide you with exceptional heart care, we have created designated Provider Care Teams.  These Care Teams include your primary Cardiologist (physician) and Advanced Practice Providers (APPs -  Physician Assistants and Nurse Practitioners) who all work together to provide you with the care you need, when you need it.  We recommend signing up for the patient portal called "MyChart".  Sign up information is provided on this After Visit Summary.  MyChart is used to connect with patients for Virtual Visits (Telemedicine).  Patients are able to view lab/test results, encounter notes, upcoming appointments, etc.  Non-urgent messages can be sent to your provider as well.   To learn more about what you can do with MyChart, go to [email or web address].    Your next appointment:    April 2025  Provider:   Dr Tresa Endo

## 2022-11-21 NOTE — Progress Notes (Signed)
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  In 2011, he developed new onset abnormal sensation in his right face, arm and leg and presented to [facility].  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  An MRI showed ventricular size to be normal and there was a 7 to 11 mm benign cyst in the right basal ganglier.  Cerebral tonsils and foramen magnum were within normal limits.  He was evaluated by [name] and was felt possibly at that time to have suffered a left brainstem or subcortical transient ischemic activity, but ultimately according to 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  At the time, he was started on Plavix as well as lipid-lowering therapy with Crestor after his lipid panel showed total cholesterol 245, triglycerides 157, HDL 37, LDL 177.  He was subsequently evaluated by me but I do not have records of my office note from the [facility].  He had undergone an echo Doppler study at that time and also underwent advanced lipid testing with [facility] heart lab on October 31, 2009.  ApoB was 103.  C-reactive protein was intermediary at 2.5 mg/L.  Reportedly LP(a) was normal.  He was subsequently referred to Dr. Porfirio Mylar Dohmeier and has been on CPAP therapy for obstructive sleep apnea.  In 2014, he was underwent a nuclear stress test in which he completed stage IV the Bruce protocol without ECG changes and had normal myocardial perfusion with EF at 62% and normal wall motion.  In 2019  he underwent a 2D echo Doppler study on July 24, 2017 which showed normal systolic function with EF 55 to 60%.   There was moderate focal basal hypertrophy.  Wall motion was normal.  There was grade 2 diastolic dysfunction.  He was noted to have mild dilatation of ascending aorta at 40 mm.  There was mild left atrial dilatation.  There was no significant valvular pathology.  As part of [facility] he underwent a CT cardiac calcium score on April 20, 2020 which revealed an elevated calcium score at 1184 Agaston units: LAD calcification was 367, left circumflex 125, and RCA 693  units.  He has been followed by [name] at [facility] for his primary care.  Most recently, he has been on rosuvastatin 20 mg and Zetia 10 mg for hyperlipidemia, vitamin D 2000 units daily, AndroGel transdermally, 81 mg aspirin, clopidogrel 75 mg, olmesartan 20 mg, and has a prescription for sildenafil to take as needed.  He  saw [name] and had noticed in April/May 2023 while playing pickle ball with increased heart rate he developed some trouble catching his breath.  This occurred when he was playing on a very hot afternoon.  Since his retirement 2 years ago, he exercises regularly and typically may walk 7-1/2 miles per day.  In addition he has been doing Pilates 2 times per week.  He has noticed occasional shortness of breath while doing Pilates but denies any associated chest pain.  Laboratory in May 2022 on rosuvastatin 10 mg total cholesterol was 151,  triglycerides 70, HDL 51 and LDL 84.  Rosuvastatin dose was increased to 20 mg and subsequent laboratory in September 2022 showed total cholesterol 124, triglycerides 84, HDL 46 and LDL 61.  Most recent laboratory in May 2023 showed total cholesterol 124, triglycerides 79, HDL 36 and LDL 72.  With his recent awareness of some exertional shortness of breath while playing pickle ball and Pilates he presented to reestablish cardiology care after I had not seen him in the office in many years.  When I saw him for my evaluation on October 14, 2021, he was feeling well and denied any  chest pain.  At times he was walking up to 10 miles per day with several long walks without symptoms.  Follow-up lipid studies in May 2023 by [name] showed a total cholesterol 124, triglycerides 79, LDL 72 and HDL 36 on Zetia 10 mg and rosuvastatin 20 mg.  I suggested more aggressive treatment and recommended he increase rosuvastatin to 40 mg and attempt to reduce LDL cholesterol less than 55 or better.  I recommended he undergo an echo Doppler as well as coronary CTA evaluation.  His echo Doppler study on November 15, 2021 continue to show normal LV function with EF 60 to 65% with normal wall motion.  There was grade 2 diastolic dysfunction.  He had normal pulmonary pressures.  RV size and function was normal.  There was very mild LA dilation and very mild dilation of aortic root at 39 mm and ascending aorta at 38 mm.  Coronary CTA on November 18, 2021  revealed a calcium score at 1524 Agatston units.  Left main was normal.  There was mixed plaque in the proximal LAD with moderate stenosis of 50 to 69%.  The circumflex vessel had 1 to 24% stenosis proximally and 25 to 49% in the AV groove circumflex.  The RCA had extensive mixed plaque and there was concern for possible severe stenosis in the distal RCA with FFR 0.73 in the proximal PDA and 0.72 in the proximal PLV.  Following his noninvasive studies, I contacted the patient.  He was planning a 2-week trip to [place].  At that time he continued to be asymptomatic.  In light of his CTA findings, I called in a prescription for as needed sublingual nitroglycerin.   He underwent follow-up laboratory upon his return from his 2-week trip and total cholesterol is now 103, triglycerides 72, HDL 40, and LDL cholesterol 48 on Zetia 10 mg and rosuvastatin 40 mg.  LP(a) was normal at 49.  I saw him in follow-up on January 29, 2022 and he remained, he asymptomatic with his extensive walking of at least 6-8 times up to 14 miles per day.  On his [place] trip he  was walking on a treadmill without difficulty.  Only rarely has he noticed some mild shortness of breath with HIT Pilates. He and his wife presents to the office for follow-up evaluation. During that evaluation, I had an extensive discussion with both he and his wife.  I discussed the hemodynamic significance of his distal RCA calcified stenosis.  I discussed potential definitive evaluation with cardiac catheterization with possible need for atherectomy or intravascular lithotripsy if significant calcification was present and the risk/benefits of the procedure were discussed in detail.  At the time, he continued to be asymptomatic and was not on any medical therapy.  We discussed continued close surveillance.  I elected to initiate low-dose beta-blocker therapy as both initial antianginal benefit as well  as continued optimal blood pressure and heart rate control.  After much discussion he was felt to pursue the initial medical regimen.  I last saw him on April 14, 2022 at which time he continued to be stable.   He does note some slight fatigue since initiating rosuvastatin but denies any associated symptoms.  He continues to walk at least 6 miles per day and denies any exertional symptoms of chest pain or shortness of breath.  He recently underwent laboratory at [facility] but has not yet received the results.  Both his daughters are attorneys and 1 recently had her first baby locally.  His other daughter who lives in [place] and will be having a baby in June for which he and his wife plan to be up there and assist for approximately a month.  During that evaluation, I had an extensive discussion with both he and his wife.  He was remaining completely asymptomatic.  I had extensive discussion concerning potential definitive diagnostic cardiac catheterization with potential need for atherectomy or shockwave lithotripsy.  Since he was not on any medical therapy after much discussion I recommended that he start low-dose  beta-blocker therapy for potential anti-ischemic benefit and for optimal blood pressure and heart rate control with continued aggressive lipid management.  Close medical follow-up was advised with continued medical therapy approach and if there was any change in symptomatology definitive cardiac catheterization was recommended.  Over the past 7 months, Darren Hull has continued to be entirely asymptomatic.  He tells me he has had several blood tests since I saw him.  Most recent lipid studies on November 10, 2022 showed total cholesterol 115, HDL 44, LDL 43, and triglycerides 542.  He is walking at least 3 times per week for 4 to 5 miles at a time and continues to do Pilates 2 times per week with jumping periods during Pilates and remains entirely asymptomatic.  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  He continues to use CPAP with his ResMed AirSense 11 auto unit followed by Dr. Vickey Huger.  He is here with his wife for further evaluation.   Past Medical History:  Diagnosis Date   Hypercholesteremia    Hypertension    OSA on CPAP 05/05/2014   Thyroid disease    TIA (transient ischemic attack)     Past Surgical History:  Procedure Laterality Date   adnoidectomy     VASECTOMY      Current Medications: Outpatient Medications Prior to Visit  Medication Sig Dispense Refill   aspirin 81 MG tablet Take 81 mg by mouth every morning.      Cholecalciferol (VITAMIN D) 2000 UNITS tablet Take 2,000 Units by mouth every morning.      clopidogrel (PLAVIX) 75 MG tablet Take 75 mg by mouth every morning.      ezetimibe (ZETIA) 10 MG tablet Take 10 mg by mouth every morning.      folic acid (FOLVITE) 800 MCG tablet Take 800 mcg by mouth every morning.      levothyroxine (SYNTHROID) 125 MCG tablet Take 125 mcg by mouth daily before breakfast.     metoprolol succinate (TOPROL XL) 25 MG 24 hr tablet Take 1 tablet (25 mg total) by mouth daily. 90 tablet 3   olmesartan (BENICAR)  20 MG tablet Take 20 mg by mouth every morning.      pantoprazole (PROTONIX) 40 MG tablet Take 40 mg by mouth daily.     rosuvastatin (CRESTOR) 40  MG tablet TAKE 1 TABLET BY MOUTH EVERY DAY IN THE MORNING 90 tablet 3   sildenafil (VIAGRA) 100 MG tablet TAKE 1 TABLET EVERY DAY AS NEEDED Oral     Testosterone (ANDROGEL) 20.25 MG/1.25GM (1.62%) GEL Apply 2 pumps daily as directed Transdermal     zaleplon (SONATA) 10 MG capsule Take 10 mg by mouth at bedtime as needed.     nitroGLYCERIN (NITROSTAT) 0.4 MG SL tablet Place 1 tablet (0.4 mg total) under the tongue every 5 (five) minutes as needed. (Patient not taking: Reported on 10/31/2022) 25 tablet 3   No facility-administered medications prior to visit.     Allergies:   Hydrochlorothiazide   Social History   Socioeconomic History   Marital status: Married    Spouse name: Not on file   Number of children: Not on file   Years of education: MD   Highest education level: Not on file  Occupational History   Occupation: neurosurgeon  Tobacco Use   Smoking status: Never   Smokeless tobacco: Not on file  Substance and Sexual Activity   Alcohol use: No    Alcohol/week: 0.0 standard drinks of alcohol    Comment: occasional glass of wine 2x weekly   Drug use: Never   Sexual activity: Yes  Other Topics Concern   Not on file  Social History Narrative   Drinks average of 1 cup coffee daily.   Social Determinants of Health   Financial Resource Strain: Low Risk  (10/24/2022)   Overall Financial Resource Strain (CARDIA)    Difficulty of Paying Living Expenses: Not hard at all  Food Insecurity: No Food Insecurity (10/24/2022)   Hunger Vital Sign    Worried About Running Out of Food in the Last Year: Never true    Ran Out of Food in the Last Year: Never true  Transportation Needs: No Transportation Needs (10/24/2022)   PRAPARE - Administrator, Civil Service (Medical): No    Lack of Transportation (Non-Medical): No  Physical  Activity: Sufficiently Active (10/24/2022)   Exercise Vital Sign    Days of Exercise per Week: 5 days    Minutes of Exercise per Session: 60 min  Stress: No Stress Concern Present (10/24/2022)   [employer] of Occupational Health - Occupational Stress Questionnaire    Feeling of Stress : Only a little  Social Connections: Socially Integrated (10/24/2022)   Social Connection and Isolation Panel [NHANES]    Frequency of Communication with Friends and Family: More than three times a week    Frequency of Social Gatherings with Friends and Family: More than three times a week    Attends Religious Services: More than 4 times per year    Active Member of [employer] or Organizations: Yes    Attends Banker Meetings: More than 4 times per year    Marital Status: Married    Socially he attended [employer] and was in the 6-year college/medical school program.  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  He exercises regularly and walks at least 7 miles per day.  He is married to Jaynie Collins and both his daughters are Engineer, manufacturing systems.  There is no tobacco use.  Family History:  The patient's family history includes Atrial fibrillation in his mother; Cerebral palsy in his brother; Non-Hodgkin's lymphoma in his father; Ovarian cancer in his paternal aunt; Pancreatic cancer in his cousin; Pulmonary embolism in his brother; Stroke in his mother.   ROS General: Negative; No  fevers, chills, or night sweats;  HEENT: Negative; No changes in vision or hearing, sinus congestion, difficulty swallowing Pulmonary: Negative; No cough, wheezing, shortness of breath, hemoptysis Cardiovascular: Negative; No chest pain, presyncope, syncope, palpitations GI: Negative; No nausea, vomiting, diarrhea, or abdominal pain GU: Negative; No dysuria, hematuria, or difficulty voiding Musculoskeletal: Negative; no myalgias, joint pain, or weakness Hematologic/Oncology: Recently diagnosed primary  cutaneous marginal zone lymphoma followed by Dr. Alcide Evener Endocrine: Positive for hypothyroidism currently on levothyroxine 137 mcg 6 days a week and one half a pill on Sundays Neuro: Negative; no changes in balance, headaches Skin: Negative; No rashes or skin lesions Psychiatric: Negative; No behavioral problems, depression Sleep: On CPAP followed by Dr. Porfirio Mylar Dohmeier since 2012.  He is unaware of breakthrough snoring.  No daytime sleepiness, hypersomnolence, bruxism, restless legs, hypnogognic hallucinations, no cataplexy Other comprehensive 14 point system review is negative.   PHYSICAL EXAM:   VS:  BP 128/78 (BP Location: Left Arm, Patient Position: Sitting, Cuff Size: Normal)   Pulse (!) 55   Ht 5\' 8"  (1.727 m)   Wt 227 lb (103 kg)   SpO2 96%   BMI 34.52 kg/m     Repeat blood pressure by me was excellent at 100/68 supine; 110/68 sitting.  Wt Readings from Last 3 Encounters:  11/21/22 227 lb (103 kg)  10/31/22 223 lb 6.4 oz (101.3 kg)  10/24/22 226 lb 9.6 oz (102.8 kg)   General: Alert, oriented, no distress.  Skin: Small lesion found to be primary cutaneous marginal zone lymphoma HEENT: Normocephalic, atraumatic. Pupils equal round and reactive to light; sclera anicteric; extraocular muscles intact;  Nose without nasal septal hypertrophy Mouth/Parynx benign; Mallinpatti scale 3 Neck: No JVD, no carotid bruits; normal carotid upstroke Lungs: clear to ausculatation and percussion; no wheezing or rales Chest wall: without tenderness to palpitation Heart: PMI not displaced, RRR, s1 s2 normal, 1/6 systolic murmur, no diastolic murmur, no rubs, gallops, thrills, or heaves Abdomen: soft, nontender; no hepatosplenomehaly, BS+; abdominal aorta nontender and not dilated by palpation. Back: no CVA tenderness Pulses 2+ Musculoskeletal: full range of motion, normal strength, no joint deformities Extremities: no clubbing cyanosis or edema, Homan's sign negative  Neurologic: grossly  nonfocal; Cranial nerves grossly wnl Psychologic: Normal mood and affect     Studies/Labs Reviewed:    EKG Interpretation Date/Time:  Friday November 21 2022 16:42:21 EST Ventricular Rate:  55 PR Interval:  186 QRS Duration:  104 QT Interval:  448 QTC Calculation: 428 R Axis:   -54  Text Interpretation: Sinus bradycardia Left anterior fascicular block Moderate voltage criteria for LVH, may be normal variant ( R in aVL , Cornell product ) Confirmed by [name] ([id number]) on 11/21/2022 5:27:27 PM   April 14, 2022 ECG (independently read by me): NSR at 61 bpm, mild sinus arrythmia, normal intervals  January 29, 2022 ECG (independently read by me): Normal sinus rhythm at 64 bpm but very mild sinus arrhythmia.  Left anterior hemiblock, unchanged.  Borderline LVH.  Early transition.  October 14, 2021 ECG (independently read by me): NSR at 61, LAHB, LVH  Recent Labs:    Latest Ref Rng & Units 10/24/2022   11:03 AM 01/14/2022    9:50 AM 11/13/2021    2:32 PM  BMP  Glucose 70 - 99 mg/dL 914  782  956   BUN 8 - 23 mg/dL 18  15  19    Creatinine 0.61 - 1.24 mg/dL 2.13  0.86  5.78   BUN/Creat Ratio 10 - 24  12  17   Sodium 135 - 145 mmol/L 135  140  138   Potassium 3.5 - 5.1 mmol/L 4.3  4.7  4.6   Chloride 98 - 111 mmol/L 101  102  102   CO2 22 - 32 mmol/L 28  24  22    Calcium 8.9 - 10.3 mg/dL 16.1  9.7  9.6         Latest Ref Rng & Units 10/24/2022   11:03 AM 01/14/2022    9:50 AM 10/23/2009    7:00 AM  Hepatic Function  Total Protein 6.5 - 8.1 g/dL 7.5  7.0  7.6   Albumin 3.5 - 5.0 g/dL 4.7  4.6  4.2   AST 15 - 41 U/L 31  26  24    ALT 0 - 44 U/L 34  35  23   Alk Phosphatase 38 - 126 U/L 46  52  41   Total Bilirubin 0.3 - 1.2 mg/dL 0.9  0.7  1.2        Latest Ref Rng & Units 10/24/2022   11:03 AM 10/11/2012    5:22 AM 10/11/2012    4:54 AM  CBC  WBC 4.0 - 10.5 K/uL 5.5   5.1   Hemoglobin 13.0 - 17.0 g/dL 09.6  04.5  40.9   Hematocrit 39.0 - 52.0 % 46.8  47.0  44.7    Platelets 150 - 400 K/uL 163   156    Lab Results  Component Value Date   MCV 82.8 10/24/2022   MCV 84.2 10/11/2012   MCV 84.4 10/23/2009   No results found for: "TSH" Lab Results  Component Value Date   HGBA1C (H) 10/23/2009    5.9 (NOTE)                                                                       According to the ADA Clinical Practice Recommendations for 2011, when HbA1c is used as a screening test:   >=6.5%   Diagnostic of Diabetes Mellitus           (if abnormal result  is confirmed)  5.7-6.4%   Increased risk of developing Diabetes Mellitus  References:Diagnosis and Classification of Diabetes Mellitus,Diabetes Care,2011,34(Suppl 1):S62-S69 and Standards of Medical Care in         Diabetes - 2011,Diabetes Care,2011,34  (Suppl 1):S11-S61.     BNP No results found for: "BNP"  ProBNP No results found for: "PROBNP"   Lipid Panel     Component Value Date/Time   CHOL 103 01/14/2022 0950   TRIG 72 01/14/2022 0950   HDL 40 01/14/2022 0950   CHOLHDL 2.6 01/14/2022 0950   CHOLHDL 6.6 10/23/2009 0823   VLDL 31 10/23/2009 0823   LDLCALC 48 01/14/2022 0950   LABVLDL 15 01/14/2022 0950     RADIOLOGY: No results found.   Additional studies/ records that were reviewed today include:  I have reviewed the patient's prior discharge summary from his October 23, 2009 hospitalization.  Records of [name] at [facility] were reviewed as well as his prior nuclear medicine study, echocardiographic evaluation and CT cardiac calcium score.  ASSESSMENT:    1. CAD in native artery   2. Elevated coronary artery calcium score: 1524 Agatston  units   3. Essential hypertension   4. OSA on CPAP   5. Grade II diastolic dysfunction   6. Mild obesity   7. Acquired hypothyroidism   8. 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  He was  felt possibly to have had a TIA but definitive diagnosis was not established.  At the time he had significant hyperlipidemia with LDL cholesterol at 177.  He also was found to have obstructive sleep apnea and has been on CPAP therapy since 2012.  He denies any significant chest pain.  He underwent a nuclear perfusion study in October 2014 which was normal.  He completed stage IV the Bruce protocol and reached a heart rate of 151 and peak blood pressure 189/74.  In 2019, subsequent echo Doppler study showed normal systolic function and there was evidence for moderate focal basal hypertrophy.  There was grade 2 diastolic dysfunction and there was mild dilatation of his ascending aorta at 40 mm.  Atrial septum was increased in thickness consistent with lipomatous hypertrophy.  He  remained asymptomatic and has been treated with lipid-lowering therapy since his transient neurologic event in 2011.  He underwent CT cardiac scoring per [facility] in 2021 which disclosed significant elevation of his calcium score at 1184 Agatston units with calcification predominantly in the RCA and LAD and mildly in the left circumflex.  Since his retirement in May 2021 he has significantly increased his level of activity and has been successful with weight loss.  He typically walks at least 2 times per day often for 7 to 10 miles without difficulty.  However recently he had noticed with more rapid movement such as playing pickle ball on a very hot day or doing certain Pilates moves he has experienced some increased shortness of breath.  I reviewed office records from [name].  When I saw him in October 2023 with his LDL cholesterol of 72 on Zetia 10 mg and rosuvastatin 20 mg I recommended more aggressive therapy and titrated rosuvastatin to 40 mg daily with target LDL less than 55.  Coronary CTA on November 18, 2021 revealed calcium score 1524.  The left main was normal.  There was mixed plaque in the proximal LAD with  moderate stenosis of 50 to 69%.  Circumflex vessel had 1 to 24% proximal stenosis with 25 to 49% in the AV groove.  The RCA had extensive mixed plaque and there was concern for possible significant stenosis in the distal RCA with positive FFR.  Subsequent laboratory on January 14, 2022 has now showed LDL cholesterol at 48, with total cholesterol 103, triglycerides 72 and HDL 40.  He continues to have normal LV systolic function noted on echocardiography in November 2023 and had grade 2 diastolic dysfunction.  At his evaluation in January 2024 I had an extensive discussion with both he and his wife concerning his coronary CTA findings.  At that time he was not on medical therapy and continues to be completely asymptomatic.  I discussed definitive cardiac catheterization and if significant calcification is present potential need for atherectomy or shockwave lithotripsy.  I empirically added low-dose beta-blocker therapy.  His blood pressure today is on the low side but not orthostatic.  He is on olmesartan 20 mg daily.  He has been taking metoprolol succinate at just 12.5 mg daily.  He remains completely asymptomatic despite  his level of activity.  He is on DAPT with aspirin/Plavix and continues to be on Zetia 10 mg in addition to rosuvastatin 40 mg for aggressive lipid-lowering therapy.  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 Alcide Evener.  Presently, he continues to be asymptomatic and has not had any chest pain develop.  He is on levothyroxine 125 mcg for hypothyroidism.  He continues to use CPAP followed by Dr. Vickey Huger.  I will see him in 5 months for follow-up evaluation or sooner as needed.   Medication Adjustments/Labs and Tests Ordered: Current medicines are reviewed at length with the patient today.  Concerns regarding medicines are outlined above.  Medication changes, Labs and Tests ordered today are listed in the  Patient Instructions below. Patient Instructions  Medication Instructions:  No changes *If you need a refill on your cardiac medications before your next appointment, please call your pharmacy*  Follow-Up: At [facility], you and your health needs are our priority.  As part of our continuing mission to provide you with exceptional heart care, we have created designated Provider Care Teams.  These Care Teams include your primary Cardiologist (physician) and Advanced Practice Providers (APPs -  Physician Assistants and Nurse Practitioners) who all work together to provide you with the care you need, when you need it.  We recommend signing up for the patient portal called "MyChart".  Sign up information is provided on this After Visit Summary.  MyChart is used to connect with patients for Virtual Visits (Telemedicine).  Patients are able to view lab/test results, encounter notes, upcoming appointments, etc.  Non-urgent messages can be sent to your provider as well.   To learn more about what you can do with MyChart, go to [email or web address].    Your next appointment:    April 2025  Provider:   Dr Tresa Endo    Signed, [name], MD  12/01/2022 2:26 PM    [facility] [address], [address], [place], [place]  [address] Phone: [phone]

## 2022-11-24 ENCOUNTER — Inpatient Hospital Stay: Payer: Medicare Other | Attending: Oncology | Admitting: Genetic Counselor

## 2022-11-24 ENCOUNTER — Other Ambulatory Visit: Payer: Self-pay

## 2022-11-24 ENCOUNTER — Inpatient Hospital Stay: Payer: Medicare Other

## 2022-11-24 DIAGNOSIS — Z8041 Family history of malignant neoplasm of ovary: Secondary | ICD-10-CM

## 2022-11-24 DIAGNOSIS — C8519 Unspecified B-cell lymphoma, extranodal and solid organ sites: Secondary | ICD-10-CM | POA: Diagnosis not present

## 2022-11-24 DIAGNOSIS — Z807 Family history of other malignant neoplasms of lymphoid, hematopoietic and related tissues: Secondary | ICD-10-CM | POA: Diagnosis not present

## 2022-11-24 DIAGNOSIS — Z800 Family history of malignant neoplasm of digestive organs: Secondary | ICD-10-CM | POA: Diagnosis not present

## 2022-11-26 ENCOUNTER — Encounter: Payer: Self-pay | Admitting: Genetic Counselor

## 2022-11-26 NOTE — Progress Notes (Signed)
REFERRING PROVIDER: Ladene Artist, MD [address] [place],  [place] [address]  PRIMARY PROVIDER:  Cleatis Hull., MD  PRIMARY REASON FOR VISIT:  1. Cutaneous B-cell lymphoma (HCC)   2. Family history of ovarian cancer   3. Family history of pancreatic cancer   4. Family history of lymphoma     HISTORY OF PRESENT ILLNESS:   Darren Hull, a 67 y.o. male, was seen for a [place] cancer genetics consultation at the request of Darren Hull due to a family history of ovarian cancer and Ashkenazi Jewish ancestry.  Darren Hull presents to clinic today to discuss the possibility of a hereditary predisposition to cancer, to discuss genetic testing, and to further clarify his future cancer risks as well as potential cancer risks for family members.   In September 2024, at the age of 4, Darren Hull was diagnosed with cutaneous B-cell lymphoma.  He has two skin lesions (left upper arm and mid upper back). PET in October 2024 showed no evidence of systemic lymphoma.  He is being followed by Darren Hull for observation.    SCREENING/RISK FACTORS:  PSA and DRE annually.  Dermatology screening at least annually.  Colonoscopy in 2021.  Two polyps.  Repeat colonoscopy planned in 5 year interval.   Past Medical History:  Diagnosis Date   Hypercholesteremia    Hypertension    OSA on CPAP 05/05/2014   Thyroid disease    TIA (transient ischemic attack)     Past Surgical History:  Procedure Laterality Date   adnoidectomy     VASECTOMY      FAMILY HISTORY:  We obtained a detailed, 4-generation family history.  Significant diagnoses are listed below: Family History  Problem Relation Age of Onset   Non-Hodgkin's lymphoma Father        dx 74   Ovarian cancer Paternal Aunt        d. 38   Pancreatic cancer Cousin        d. 42; maternal male cousin     Darren Hull is unaware of previous family history of genetic testing for hereditary cancer risks.   Patient's  maternal ancestors are of [place] descent, and paternal ancestors are of [place] descent. He reported maternal and paternal Ashkenazi Jewish ancestry.   GENETIC COUNSELING ASSESSMENT: Darren Hull is a 67 y.o. male with a family history which is somewhat suggestive of a hereditary cancer syndrome and predisposition to cancer given the presence of ovarian cancer in his paternal family in the context of Ashkenazi Jewish ancestry. We, therefore, discussed and recommended the following at today's visit.   DISCUSSION:   We discussed that, in general, most cancer is not inherited in families, but instead is sporadic or familial. Sporadic cancers occur by chance and typically happen at older ages (>50 years) as this type of cancer is caused by genetic changes acquired during an individual's lifetime. Some families have more cancers than would be expected by chance; however, the ages or types of cancer are not consistent with a known genetic mutation or known genetic mutations have been ruled out. This type of familial cancer is thought to be due to a combination of multiple genetic, environmental, hormonal, and lifestyle factors. While this combination of factors likely increases the risk of cancer, the exact source of this risk is not currently identifiable or testable.    We discussed that 5 - 10% of cancer is hereditary, with most cases of hereditary ovarian and pancreatic cancer associated with  mutations in BRCA1/2.  There are other genes that can be associated with hereditary ovarian, pancreatic, or other cancer syndromes.  We discussed that testing is beneficial for several reasons, including knowing about other cancer risks, identifying potential screening and risk-reduction options that may be appropriate, and to understanding if other family members could be at risk for cancer and allowing them to undergo genetic testing.  We reviewed the characteristics, features and inheritance patterns of  hereditary cancer syndromes. We also discussed genetic testing, including the appropriate family members to test, the process of testing, insurance coverage and turn-around-time for results. We discussed the implications of a negative, positive, and/or variant of uncertain significant result.  We recommended Darren Hull pursue genetic testing for a panel that contains genes associated with ovarian, pancreatic, and other cancers.  We discussed that while the vast majority of lymphoma is not hereditary; there are some identifiable hereditary causes.  Many of these hereditary causes are associated with other features, such as ataxia or immunodeficiency.  Darren Hull did not report any features that are suspicious for a syndromic cause of lymphoma; however, genes associated with lymphoma can be added onto his testing.  Darren Hull was interested in pursuing testing for hereditary lymphoma genes as well.   The Multi-Cancer + RNA Panel offered by [facility] includes sequencing and/or deletion/duplication analysis of the following 70 genes:  AIP*, ALK, APC*, ATM*, AXIN2*, BAP1*, BARD1*, BLM*, BMPR1A*, BRCA1*, BRCA2*, BRIP1*, CDC73*, CDH1*, CDK4, CDKN1B*, CDKN2A, CHEK2*, CTNNA1*, DICER1*, EPCAM (del/dup only), EGFR, FH*, FLCN*, GREM1 (promoter dup only), HOXB13, KIT, LZTR1, MAX*, MBD4, MEN1*, MET, MITF, MLH1*, MSH2*, MSH3*, MSH6*, MUTYH*, NF1*, NF2*, NTHL1*, PALB2*, PDGFRA, PMS2*, POLD1*, POLE*, POT1*, PRKAR1A*, PTCH1*, PTEN*, RAD51C*, RAD51D*, RB1*, RET, SDHA* (sequencing only), SDHAF2*, SDHB*, SDHC*, SDHD*, SMAD4*, SMARCA4*, SMARCB1*, SMARCE1*, STK11*, SUFU*, TMEM127*, TP53*, TSC1*, TSC2*, VHL*. RNA analysis is performed for * genes.  The [facility] Hereditary Lymphoma Panel includes sequencing and deletion/duplication analysis of the following 43 genes: ADA, ATM, BLM, CARD11, CARMIL2, CASP8, CD27, CTLA4, CTPS1, DOCK8, EPCAM, FADD, FAS, FASLG, FCHO1, IKZF1, IL2RA, IL2RB, ITK, MAGT1, MCM4, MLH1, MSH2, MSH6, NBN, NF1,  PIK3CD, PIK3R1, PMS2, PRKCD, RAC2, RASGRP1, RHOH, RMRP, SH2D1A, STAT3, STK4, STXBP2, TNFRSF13B, TP53, TPP2, WAS, XIAP.    Based on Darren Hull family history of ovarian cancer and Ashkenazi Jewish ancestry, he meets NCCN criteria for genetic testing.  We discussed the Genetic Information Non-Discrimination Act (GINA) of 2008, which helps protect individuals against genetic discrimination based on their genetic test results.  It impacts both health insurance and employment.  With health insurance, it protects against genetic test results being used for increased premiums or policy termination. For employment, it protects against hiring, firing and promoting decisions based on genetic test results.  GINA does not apply to those in the [employer], those who work for companies with less than 15 employees, and new life insurance or long-term disability insurance policies.  Health status due to a cancer diagnosis is not protected under GINA.  PLAN: After considering the risks, benefits, and limitations, Darren Hull provided informed consent to pursue genetic testing.  He requested blood sample be drawn at [facility] after his appointment with Darren Hull on January 26, 2023.  Blood sample will be sent to [facility] for analysis of the Multi-Cancer +RNA Panel and Hereditary Lymphoma Panel.    Results should be available within approximately 3 weeks after sample collection, at which point they will be disclosed by telephone to Darren Hull, as will any additional recommendations warranted by  these results. Darren Hull will be out of the country from January 31st to February 24th.  He requested that we call with results at the end of February when he returns.   Darren Hull questions were answered to his satisfaction today. Our contact information was provided should additional questions or concerns arise. Thank you for the referral and allowing [place] to share in the care of your patient.   Cherril Hett M.  Rennie Plowman, MS, [facility] Genetic Counselor [email or web address] (P) [phone]   The patient was seen for a total of 40 minutes in face-to-face genetic counseling.  The patient was accompanied by his wife, Darren Hull.  Drs. Gunnar Bulla and/or Mosetta Putt were available to discuss this case as needed.  _______________________________________________________________________ For Office Staff:  Number of people involved in session: 2 Was an Intern/ student involved with case: yes; [employer] intern, Marlane Hatcher, conducted session under my direct supervision

## 2022-12-01 ENCOUNTER — Encounter: Payer: Self-pay | Admitting: Cardiovascular Disease

## 2022-12-24 ENCOUNTER — Ambulatory Visit (INDEPENDENT_AMBULATORY_CARE_PROVIDER_SITE_OTHER): Payer: Medicare Other | Admitting: Neurology

## 2022-12-24 VITALS — BP 113/69 | HR 63 | Ht 68.0 in | Wt 227.0 lb

## 2022-12-24 DIAGNOSIS — G4733 Obstructive sleep apnea (adult) (pediatric): Secondary | ICD-10-CM

## 2022-12-24 DIAGNOSIS — R7303 Prediabetes: Secondary | ICD-10-CM | POA: Insufficient documentation

## 2022-12-24 DIAGNOSIS — E66812 Obesity, class 2: Secondary | ICD-10-CM | POA: Diagnosis not present

## 2022-12-24 DIAGNOSIS — E6609 Other obesity due to excess calories: Secondary | ICD-10-CM | POA: Diagnosis not present

## 2022-12-24 NOTE — Progress Notes (Signed)
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 He is happy with his new machine- HST confirmed OSA, but a moderate level this time.  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 5/h REM AHI of 26.1/h and will resume CPAP therapy . He chose to continue his mask of choice. Fx Mirage .    Other interval history :  Metformin was started by Dr Clelia Croft  for HbA1c of 6 ( early December 2024).   Dr. [name] compliance is excellent at 97% for hours and days. Over 30 day period.    AHI residual is 1.9?h an most residuals are central.  Settings are 5 -16 cm water, EPR  2 cm water.  95% pressure 8.6 cm water.  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 Truett Perna due to a family history of ovarian cancer and Ashkenazi Jewish ancestry.  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  He has two skin lesions (left upper arm and mid upper back). PET in October 2024 showed no evidence of systemic lymphoma.  He is being followed by Dr. Truett Perna for observation.    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  In 2011, he developed new onset  abnormal sensation in his right face, arm and leg and presented to [facility].  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  An MRI showed ventricular size to be normal and there was a 7 to 11 mm benign cyst in the right basal ganglion.   Cerebral tonsils and foramen magnum were within normal limits.   He was evaluated by [name] and was felt possibly at that time to have suffered a left brainstem or subcortical transient ischemic activity, but ultimately according to Dr. Newell Darren Hull this was subsequently not felt to be the case.  At the time, he was started on Plavix as well as lipid-lowering therapy with Crestor after his lipid panel showed total cholesterol 245, triglycerides 157, HDL 37, LDL 177.  He was subsequently evaluated by me but I do not have records of my office note from the [facility].  He had undergone an echo Doppler study at that time and also underwent advanced lipid testing with [facility] heart lab on October 31, 2009.  ApoB was 103.  C-reactive protein was intermediary at 2.5 mg/L.  Reportedly LP(a) was  normal.    Review of Systems: Out of a complete 14 system review, the patient complains of only the following symptoms, and all other reviewed systems are negative.:  Fatigue, sleepiness , snoring, fragmented sleep, Insomnia, RLS, Nocturia    How likely are you to doze in the following situations: 0 = not likely, 1 = slight chance, 2 = moderate chance, 3 = high chance   Sitting and Reading? Watching Television? Sitting inactive in a public place (theater or meeting)? As a passenger in a car for an hour without a break? Lying down in the afternoon when circumstances permit? Sitting and talking to someone? Sitting quietly after lunch without alcohol? In a car, while stopped for a few minutes in traffic?   Epworth 1, FS at 15/ 64 , 0/ GDS Social History    Socioeconomic History   Marital status: Married    Spouse name: Not on file   Number of children: Not on file   Years of education: MD   Highest education level: Not on file  Occupational History   Occupation: neurosurgeon  Tobacco Use   Smoking status: Never   Smokeless tobacco: Not on file  Substance and Sexual Activity   Alcohol use: No    Alcohol/week: 0.0 standard drinks of alcohol    Comment: occasional glass of wine 2x weekly   Drug use: Never   Sexual activity: Yes  Other Topics Concern   Not on file  Social History Narrative   Drinks average of 1 cup coffee daily.   Social Drivers of Corporate investment banker Strain: Low Risk  (10/24/2022)   Overall Financial Resource Strain (CARDIA)    Difficulty of Paying Living Expenses: Not hard at all  Food Insecurity: No Food Insecurity (10/24/2022)   Hunger Vital Sign    Worried About Running Out of Food in the Last Year: Never true    Ran Out of Food in the Last Year: Never true  Transportation Needs: No Transportation Needs (10/24/2022)   PRAPARE - Administrator, Civil Service (Medical): No    Lack of Transportation (Non-Medical): No  Physical Activity: Sufficiently Active (10/24/2022)   Exercise Vital Sign    Days of Exercise per Week: 5 days    Minutes of Exercise per Session: 60 min  Stress: No Stress Concern Present (10/24/2022)   [employer] of Occupational Health - Occupational Stress Questionnaire    Feeling of Stress : Only a little  Social Connections: Socially Integrated (10/24/2022)   Social Connection and Isolation Panel [NHANES]    Frequency of Communication with Friends and Family: More than three times a week    Frequency of Social Gatherings with Friends and Family: More than three times a week    Attends Religious Services: More than 4 times per year    Active Member of [employer] or Organizations: Yes    Attends Engineer, structural: More than 4 times per year    Marital  Status: Married    Family History  Problem Relation Age of Onset   Stroke Mother    Atrial fibrillation Mother    Non-Hodgkin's lymphoma Father        dx 41   Pulmonary embolism Brother    Cerebral palsy Brother    Ovarian cancer Paternal Aunt        d. 87   Pancreatic cancer Cousin        d. 66; maternal male cousin    Past Medical History:  Diagnosis Date   Hypercholesteremia    Hypertension    OSA on CPAP 05/05/2014   Thyroid disease    TIA (transient ischemic attack)     Past Surgical History:  Procedure Laterality Date   adnoidectomy     VASECTOMY       Current Outpatient Medications on File Prior to Visit  Medication Sig Dispense Refill   aspirin 81 MG tablet Take 81 mg by mouth every morning.      Cholecalciferol (VITAMIN D) 2000 UNITS tablet Take 2,000 Units by mouth every morning.      clopidogrel (PLAVIX) 75 MG tablet Take 75 mg by mouth every morning.      ezetimibe (ZETIA) 10 MG tablet Take 10 mg by mouth every morning.      folic acid (FOLVITE) 800 MCG tablet Take 800 mcg by mouth every morning.      levothyroxine (SYNTHROID) 125 MCG tablet Take 125 mcg by mouth daily before breakfast.     metFORMIN (GLUCOPHAGE) 500 MG tablet Take 500 mg by mouth 2 (two) times daily with a meal.     metoprolol succinate (TOPROL XL) 25 MG 24 hr tablet Take 1 tablet (25 mg total) by mouth daily. 90 tablet 3   olmesartan (BENICAR) 20 MG tablet Take 20 mg by mouth every morning.      pantoprazole (PROTONIX) 40 MG tablet Take 40 mg by mouth daily.     rosuvastatin (CRESTOR) 40 MG tablet TAKE 1 TABLET BY MOUTH EVERY DAY IN THE MORNING 90 tablet 3   sildenafil (VIAGRA) 100 MG tablet TAKE 1 TABLET EVERY DAY AS NEEDED Oral     Testosterone (ANDROGEL) 20.25 MG/1.25GM (1.62%) GEL Apply 2 pumps daily as directed Transdermal     zaleplon (SONATA) 10 MG capsule Take 10 mg by mouth at bedtime as needed.     nitroGLYCERIN (NITROSTAT) 0.4 MG SL tablet Place 1 tablet (0.4 mg total) under the  tongue every 5 (five) minutes as needed. (Patient not taking: Reported on 10/31/2022) 25 tablet 3   No current facility-administered medications on file prior to visit.    Allergies  Allergen Reactions   Hydrochlorothiazide Itching and Rash    Severe rash; takes 2 dosepaks of steroids to stop rash.       DIAGNOSTIC DATA (LABS, IMAGING, TESTING) - I reviewed patient records, labs, notes, testing and imaging myself where available.  Lab Results  Component Value Date   WBC 5.5 10/24/2022   HGB 16.4 10/24/2022   HCT 46.8 10/24/2022   MCV 82.8 10/24/2022   PLT 163 10/24/2022      Component Value Date/Time   NA 135 10/24/2022 1103   NA 140 01/14/2022 0950   K 4.3 10/24/2022 1103   CL 101 10/24/2022 1103   CO2 28 10/24/2022 1103   GLUCOSE 246 (H) 10/24/2022 1103   BUN 18 10/24/2022 1103   BUN 15 01/14/2022 0950   CREATININE 1.29 (H) 10/24/2022 1103   CALCIUM 10.2 10/24/2022 1103   PROT 7.5 10/24/2022 1103   PROT 7.0 01/14/2022 0950   ALBUMIN 4.7 10/24/2022 1103   ALBUMIN 4.6 01/14/2022 0950   AST 31 10/24/2022 1103   ALT 34 10/24/2022 1103   ALKPHOS 46 10/24/2022 1103   BILITOT 0.9 10/24/2022 1103   GFRNONAA >60 10/24/2022 1103   GFRAA  10/23/2009 0700    >60        The eGFR has been calculated using the MDRD equation. This calculation has not been validated in all  clinical situations. eGFR's persistently <60 mL/min signify possible Chronic Kidney Disease.   Lab Results  Component Value Date   CHOL 103 01/14/2022   HDL 40 01/14/2022   LDLCALC 48 01/14/2022   TRIG 72 01/14/2022   CHOLHDL 2.6 01/14/2022   Lab Results  Component Value Date   HGBA1C (H) 10/23/2009    5.9 (NOTE)                                                                       According to the ADA Clinical Practice Recommendations for 2011, when HbA1c is used as a screening test:   >=6.5%   Diagnostic of Diabetes Mellitus           (if abnormal result  is confirmed)  5.7-6.4%   Increased  risk of developing Diabetes Mellitus  References:Diagnosis and Classification of Diabetes Mellitus,Diabetes Care,2011,34(Suppl 1):S62-S69 and Standards of Medical Care in         Diabetes - 2011,Diabetes Care,2011,34  (Suppl 1):S11-S61.   No results found for: "VITAMINB12" No results found for: "TSH"  PHYSICAL EXAM:  Today's Vitals   12/24/22 1042  BP: 113/69  Pulse: 63  Weight: 227 lb (103 kg)  Height: 5\' 8"  (1.727 m)   Body mass index is 34.52 kg/m.   Wt Readings from Last 3 Encounters:  12/24/22 227 lb (103 kg)  11/21/22 227 lb (103 kg)  10/31/22 223 lb 6.4 oz (101.3 kg)     Ht Readings from Last 3 Encounters:  12/24/22 5\' 8"  (1.727 m)  11/21/22 5\' 8"  (1.727 m)  08/25/22 5\' 8"  (1.727 m)     The patient is awake, alert and appears not in acute distress. The patient is well groomed. Head: Normocephalic, atraumatic. Neck is supple. Mallampati 2,  neck circumference:19 inches . Nasal airflow remains  patent.  Retrognathia is not seen.  Facial hair is present Dental status: intact  Cardiovascular:  Regular rate and cardiac rhythm by pulse,  without distended neck veins. Respiratory: Lungs are clear to auscultation.  Skin:  Without evidence of ankle edema, or rash. Trunk: The patient's posture is erect.   NEUROLOGIC EXAM: The patient is awake and alert, oriented to place and time.   Memory subjective described as intact.  Attention span & concentration ability appears normal.  Speech is fluent,  without  dysarthria, dysphonia or aphasia.  Mood and affect are appropriate.   Cranial nerves: no loss of smell or taste reported  Pupils are equal and briskly reactive to light. Funduscopic exam def.  Extraocular movements in vertical and horizontal planes were intact and without nystagmus. No Diplopia. Visual fields by finger perimetry are intact. Hearing was intact to soft voice and finger rubbing.    Facial sensation intact to fine touch.  Facial motor strength is symmetric  and tongue and uvula move midline.  Neck ROM : rotation, tilt and flexion extension were normal for age and shoulder shrug was symmetrical.    Motor exam:  Symmetric bulk, tone and ROM.   Normal tone without cog -wheeling, symmetric grip strength .    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   2)  risk factor of OSA is airway anatomy and BMI, neck size. Weight loss is recommended.   3) new diagnosis of DM, new diagnosis B cell Lymphoma, primary cutaneus.    I plan to follow up either personally or through our NP within  12 months.   I would like to thank [name], MD for allowing me to meet with and to take care of this pleasant patient.   CC: I will share my notes with Mancel Bale , MD .  After spending a total time of  35  minutes face to face and additional time for physical and neurologic examination, review of laboratory studies,  personal review of imaging studies, reports and results of other testing and review of referral information / records as far as provided in visit,   Electronically signed by: Melvyn Novas, MD 12/24/2022 11:06 AM  [facility] Board certified by The [employer] of Sleep Medicine and Diplomate of the [employer] of Sleep Medicine. Board certified In Neurology through the ABPN, Fellow of the [employer] of Neurology.

## 2023-01-14 ENCOUNTER — Other Ambulatory Visit: Payer: Self-pay | Admitting: Cardiovascular Disease

## 2023-01-23 ENCOUNTER — Telehealth: Payer: Self-pay | Admitting: Genetic Counselor

## 2023-01-23 NOTE — Telephone Encounter (Signed)
Called patient has genetics labs will be drawn on 1/20 at [facility].  Discussed with patient that Multi-Cancer +RNA panel and Lymphoma genes are run on two different platforms and thus would require 2 kits and be billed twice.  Discussed option for Multi-Cancer + Lymphoma genes (no RNA/no special targets') or Multi-Cancer +RNA only.  Reminded patient that PH/FH is not highly consistent with identifiable lymphoma syndrome.  Patient wished to explore Multi-Cancer +RNA and Hereditary Lymphoma Genes, understanding that it would be billed separately.  Agreed to request htat lab notify him (before 1/30-- going out of country) if any OOP is >$250.

## 2023-01-26 ENCOUNTER — Inpatient Hospital Stay: Payer: Medicare Other

## 2023-01-26 ENCOUNTER — Inpatient Hospital Stay: Payer: Medicare Other | Attending: Oncology | Admitting: Oncology

## 2023-01-26 ENCOUNTER — Other Ambulatory Visit: Payer: Medicare Other

## 2023-01-26 VITALS — BP 153/82 | HR 59 | Temp 97.5°F | Resp 20 | Ht 68.0 in | Wt 229.8 lb

## 2023-01-26 DIAGNOSIS — Z807 Family history of other malignant neoplasms of lymphoid, hematopoietic and related tissues: Secondary | ICD-10-CM | POA: Diagnosis not present

## 2023-01-26 DIAGNOSIS — I2510 Atherosclerotic heart disease of native coronary artery without angina pectoris: Secondary | ICD-10-CM | POA: Diagnosis not present

## 2023-01-26 DIAGNOSIS — Z8041 Family history of malignant neoplasm of ovary: Secondary | ICD-10-CM | POA: Diagnosis not present

## 2023-01-26 DIAGNOSIS — Z8673 Personal history of transient ischemic attack (TIA), and cerebral infarction without residual deficits: Secondary | ICD-10-CM | POA: Diagnosis not present

## 2023-01-26 DIAGNOSIS — I10 Essential (primary) hypertension: Secondary | ICD-10-CM | POA: Diagnosis not present

## 2023-01-26 DIAGNOSIS — E039 Hypothyroidism, unspecified: Secondary | ICD-10-CM | POA: Insufficient documentation

## 2023-01-26 DIAGNOSIS — C8519 Unspecified B-cell lymphoma, extranodal and solid organ sites: Secondary | ICD-10-CM

## 2023-01-26 DIAGNOSIS — G4730 Sleep apnea, unspecified: Secondary | ICD-10-CM | POA: Insufficient documentation

## 2023-01-26 DIAGNOSIS — E291 Testicular hypofunction: Secondary | ICD-10-CM | POA: Diagnosis not present

## 2023-01-26 DIAGNOSIS — R2232 Localized swelling, mass and lump, left upper limb: Secondary | ICD-10-CM | POA: Diagnosis present

## 2023-01-26 DIAGNOSIS — K219 Gastro-esophageal reflux disease without esophagitis: Secondary | ICD-10-CM | POA: Insufficient documentation

## 2023-01-26 NOTE — Progress Notes (Signed)
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  He continues to feel like nodular lesion at the left upper arm.  No new skin lesions.  No fever.  He has occasional night sweats (he reports this is chronic).  Good appetite.  He recently started metformin for diabetes.  Objective:  Vital signs in last 24 hours:  Blood pressure (!) 153/82, pulse (!) 59, temperature (!) 97.5 F (36.4 C), temperature source Oral, resp. rate (!) 28, height 5\' 8"  (1.727 m), weight 229 lb 12.8 oz (104.2 kg), SpO2 97%.    HEENT: Oropharynx without visible mass Lymphatics: No cervical, supraclavicular, axillary, or inguinal nodes Resp: Clear bilaterally Cardio: Regular rate and rhythm GI: No hepatosplenomegaly Vascular: No leg edema  Skin: Raised lesion at the left upper arm measuring between 1 and 1.5 cm in maximal dimension, similar appearing lesion at the upper back measures between 1 and 1.5 cm in maximal dimension and is less raised, no other suspicious skin lesions   Lab Results:  Lab Results  Component Value Date   WBC 5.5 10/24/2022   HGB 16.4 10/24/2022   HCT 46.8 10/24/2022   MCV 82.8 10/24/2022   PLT 163 10/24/2022   NEUTROABS 3.5 10/24/2022    CMP  Lab Results  Component Value Date   NA 135 10/24/2022   K 4.3 10/24/2022   CL 101 10/24/2022   CO2 28 10/24/2022   GLUCOSE 246 (H) 10/24/2022   BUN 18 10/24/2022   CREATININE 1.29 (H) 10/24/2022   CALCIUM 10.2 10/24/2022   PROT 7.5 10/24/2022   ALBUMIN 4.7 10/24/2022   AST 31 10/24/2022   ALT 34 10/24/2022   ALKPHOS 46 10/24/2022   BILITOT 0.9 10/24/2022   GFRNONAA >60 10/24/2022   GFRAA  10/23/2009    >60        The eGFR has been calculated using the MDRD equation. This calculation has not been validated in all clinical situations. eGFR's persistently <60 mL/min signify possible Chronic Kidney Disease.     Medications: I  have reviewed the patient's current medications.   Assessment/Plan: Atypical lymphoid infiltrate suspicious for a B-cell lymphoproliferative disorder involving biopsies of  left upper arm and mid upper back skin lesions Left lateral shoulder biopsy 09/17/2022-B-cell component in both nodular and reticular areas,, germinal center areas display positive for CD10, BCL6, and Bcl-2, CD30 positive in larger lymphoid cells, CD138 highlights a minor plasma cell component with lambda light chain excess, no coexpression of CD5 in B-cell areas, B-cell gene rearrangement-clonal immunoglobulin kappa light chain gene rearrangement Midline superior upper back biopsy 10/02/2022-mixture of T and B cells with slight predominance of T cells, BCL6 highlights germinal centers, Bcl-2 negative.  No coexpression of CD5 in B-cell areas.  No CD10 or cyclin D1 positivity.  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  There is no clinical evidence for progression of lymphoma.  The 2 visible skin lesions appear unchanged.  The plan is to continue observation.  He will return for an office visit in 6 months.  He will call  for a change in the skin lesions or new symptoms in the interim.  He saw the genetics counselor and will have a genetics panel drawn today.  Thornton Papas, MD  01/26/2023  9:07 AM

## 2023-03-03 ENCOUNTER — Ambulatory Visit: Payer: Self-pay | Admitting: Genetic Counselor

## 2023-03-03 ENCOUNTER — Encounter: Payer: Self-pay | Admitting: Genetic Counselor

## 2023-03-03 DIAGNOSIS — Z800 Family history of malignant neoplasm of digestive organs: Secondary | ICD-10-CM

## 2023-03-03 DIAGNOSIS — Z8041 Family history of malignant neoplasm of ovary: Secondary | ICD-10-CM

## 2023-03-03 DIAGNOSIS — Z1379 Encounter for other screening for genetic and chromosomal anomalies: Secondary | ICD-10-CM

## 2023-03-03 DIAGNOSIS — C8519 Unspecified B-cell lymphoma, extranodal and solid organ sites: Secondary | ICD-10-CM

## 2023-03-03 DIAGNOSIS — Z807 Family history of other malignant neoplasms of lymphoid, hematopoietic and related tissues: Secondary | ICD-10-CM

## 2023-03-03 NOTE — Progress Notes (Signed)
 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    Dr. Earl Hull recent genetic test results were disclosed to him by telephone. These results and recommendations are discussed in more detail below.  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  He has two skin lesions (left upper arm and mid upper back). PET in October 2024 showed no evidence of systemic lymphoma.  He is being followed by Dr. Truett Hull for observation.    FAMILY HISTORY:  We obtained a detailed, 4-generation family history.  Significant diagnoses are listed below:      Family History  Problem Relation Age of Onset   Non-Hodgkin's lymphoma Father          dx 30   Ovarian cancer Paternal Aunt          d. 8   Pancreatic cancer Cousin          d. 74; maternal male cousin       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    Patient's maternal ancestors are of [place] descent, and paternal ancestors are of [place] descent. He reported maternal and paternal Ashkenazi Jewish ancestry.     GENETIC TEST RESULTS:  The [facility] Multi-Cancer +RNA Panel and [facility] Hereditary Lymphoma Panel found no pathogenic mutations.   The Multi-Cancer + RNA Panel offered by [facility] includes sequencing and/or deletion/duplication analysis of the following 70 genes:  AIP*, ALK, APC*, ATM*, AXIN2*, BAP1*, BARD1*, BLM*, BMPR1A*, BRCA1*, BRCA2*, BRIP1*, CDC73*, CDH1*, CDK4, CDKN1B*, CDKN2A, CHEK2*, CTNNA1*, DICER1*, EPCAM (del/dup only), EGFR, FH*, FLCN*, GREM1 (promoter dup only), HOXB13, KIT, LZTR1, MAX*, MBD4, MEN1*, MET, MITF, MLH1*, MSH2*, MSH3*, MSH6*, MUTYH*, NF1*, NF2*, NTHL1*, PALB2*, PDGFRA, PMS2*, POLD1*, POLE*, POT1*,  PRKAR1A*, PTCH1*, PTEN*, RAD51C*, RAD51D*, RB1*, RET, SDHA* (sequencing only), SDHAF2*, SDHB*, SDHC*, SDHD*, SMAD4*, SMARCA4*, SMARCB1*, SMARCE1*, STK11*, SUFU*, TMEM127*, TP53*, TSC1*, TSC2*, VHL*. RNA analysis is performed for * genes.  The [facility] Hereditary Lymphoma Panel includes sequencing and deletion/duplication analysis of the following 43 genes: ADA, ATM, BLM, CARD11, CARMIL2, CASP8, CD27, CTLA4, CTPS1, DOCK8, EPCAM, FADD, FAS, FASLG, FCHO1, IKZF1, IL2RA, IL2RB, ITK, MAGT1, MCM4, MLH1, MSH2, MSH6, NBN, NF1, PIK3CD, PIK3R1, PMS2, PRKCD, RAC2, RASGRP1, RHOH, RMRP, SH2D1A, STAT3, STK4, STXBP2, TNFRSF13B, TP53, TPP2, WAS, XIAP.    The test report has been scanned into EPIC and is located under the Molecular Pathology section of the Results Review tab.  A portion of the result report is included below for reference. Genetic testing reported out on February 08, 2023 (Lymphoma Panel) and February 05, 2023 (Multi-Cancer Panel).         Even though a pathogenic variant was not identified, possible explanations for the cancer in the family may include: There may be no hereditary risk for cancer in the family. The cancers in [name] and/or his family may be sporadic/familial or due to other genetic and environmental factors.  Most cancer is not hereditary.  There may be a gene mutation in one of these genes that current testing methods cannot detect but that chance is small. There could be another gene that has not yet been discovered, or that we have not yet tested, that is responsible for the cancer diagnoses in the family.  It  is also possible there is a hereditary cause for the cancer in the family that [name] did not inherit.   Therefore, it is important to remain in touch with cancer genetics in the future so that we can continue to offer Darren Hull the most up to date genetic testing.    ADDITIONAL GENETIC TESTING:  Dr. Earl Hull genetic testing was fairly extensive.  If  there are additional relevant genes identified to increase cancer risk that can be analyzed in the future, we would be happy to discuss and coordinate this testing at that time.     CANCER SCREENING RECOMMENDATIONS:  Dr. Earl Hull test result is considered negative (normal).  This means that we have not identified a hereditary cause for his personal and family history of cancer at this time.   An individual's cancer risk and medical management are not determined by genetic test results alone. Overall cancer risk assessment incorporates additional factors, including personal medical history, family history, and any available genetic information that may result in a personalized plan for cancer prevention and surveillance. Therefore, it is recommended he continue to follow the cancer management and screening guidelines provided by his oncology and primary healthcare provider.   RECOMMENDATIONS FOR FAMILY MEMBERS:   Since he did not inherit a identifiable mutation in a cancer predisposition gene included on this panel, his children could not have inherited a known mutation from him in one of these genes. Individuals in this family might be at some increased risk of developing cancer, over the general population risk, due to the family history of cancer.  Individuals in the family should notify their providers of the family history of cancer.  Other members of the family may still carry a pathogenic variant in one of these genes that [name] did not inherit. Based on the family history, we recommend relatives more closely related to his maternal cousin who had pancreatic cancer and close relatives of his paternal aunt who had ovarian cancer have genetic counseling and testing. [name] can let us know if we can be of any assistance in coordinating genetic counseling and/or testing for these family members.     FOLLOW-UP:  Cancer genetics is a rapidly advancing field and it is possible that new  genetic tests will be appropriate for him and/or his family members in the future. We encourage [name] to remain in contact with cancer genetics, so we can update his personal and family histories and let him know of advances in cancer genetics that may benefit this family.   Our contact number was provided.  They are welcome to call us at anytime with additional questions or concerns.   Julicia Krieger M. Rennie Plowman, MS, [facility] Genetic Counselor [email or web address] (P) [phone]

## 2023-04-27 ENCOUNTER — Encounter: Payer: Self-pay | Admitting: Cardiovascular Disease

## 2023-04-27 ENCOUNTER — Ambulatory Visit: Payer: Medicare Other | Attending: Cardiovascular Disease | Admitting: Cardiovascular Disease

## 2023-04-27 DIAGNOSIS — I2510 Atherosclerotic heart disease of native coronary artery without angina pectoris: Secondary | ICD-10-CM | POA: Insufficient documentation

## 2023-04-27 DIAGNOSIS — I77819 Aortic ectasia, unspecified site: Secondary | ICD-10-CM | POA: Insufficient documentation

## 2023-04-27 DIAGNOSIS — I10 Essential (primary) hypertension: Secondary | ICD-10-CM | POA: Insufficient documentation

## 2023-04-27 DIAGNOSIS — G4733 Obstructive sleep apnea (adult) (pediatric): Secondary | ICD-10-CM | POA: Insufficient documentation

## 2023-04-27 DIAGNOSIS — E119 Type 2 diabetes mellitus without complications: Secondary | ICD-10-CM | POA: Diagnosis present

## 2023-04-27 DIAGNOSIS — E039 Hypothyroidism, unspecified: Secondary | ICD-10-CM | POA: Diagnosis present

## 2023-04-27 DIAGNOSIS — E669 Obesity, unspecified: Secondary | ICD-10-CM | POA: Insufficient documentation

## 2023-04-27 DIAGNOSIS — C884 Extranodal marginal zone b-cell lymphoma of mucosa-associated lymphoid tissue (malt-lymphoma) not having achieved remission: Secondary | ICD-10-CM | POA: Insufficient documentation

## 2023-04-27 DIAGNOSIS — E785 Hyperlipidemia, unspecified: Secondary | ICD-10-CM | POA: Diagnosis present

## 2023-04-27 DIAGNOSIS — R931 Abnormal findings on diagnostic imaging of heart and coronary circulation: Secondary | ICD-10-CM | POA: Insufficient documentation

## 2023-04-27 DIAGNOSIS — I5189 Other ill-defined heart diseases: Secondary | ICD-10-CM | POA: Diagnosis present

## 2023-04-27 MED ORDER — METOPROLOL SUCCINATE ER 25 MG PO TB24: 25.0000 mg | ORAL_TABLET | Freq: Every day | ORAL | 3 refills | Status: DC

## 2023-04-27 MED ORDER — ROSUVASTATIN CALCIUM 40 MG PO TABS: 40.0000 mg | ORAL_TABLET | Freq: Every day | ORAL | 3 refills | Status: AC

## 2023-04-27 NOTE — Patient Instructions (Signed)
 Medication Instructions:  NO CHANGES *If you need a refill on your cardiac medications before your next appointment, please call your pharmacy*  Lab Work: NO LABS If you have labs (blood work) drawn today and your tests are completely normal, you will receive your results only by: MyChart Message (if you have MyChart) OR A paper copy in the mail If you have any lab test that is abnormal or we need to change your treatment, we will call you to review the results.  Testing/Procedures: NO TESTING  Follow-Up: At [facility], you and your health needs are our priority.  As part of our continuing mission to provide you with exceptional heart care, our providers are all part of one team.  This team includes your primary Cardiologist (physician) and Advanced Practice Providers or APPs (Physician Assistants and Nurse Practitioners) who all work together to provide you with the care you need, when you need it.  Your next appointment:   6 month(s)  Provider:   Luana Rumple, MD    Other Instructions   1st Floor: - Lobby - Registration  - Pharmacy  - Lab - Cafe  2nd Floor: - PV Lab - Diagnostic Testing (echo, CT, nuclear med)  3rd Floor: - Vacant  4th Floor: - TCTS (cardiothoracic surgery) - AFib Clinic - Structural Heart Clinic - Vascular Surgery  - Vascular Ultrasound  5th Floor: - [facility] Cardiology (general and EP) - Clinical Pharmacy for coumadin, hypertension, lipid, weight-loss medications, and med management appointments    Valet parking services will be available as well.

## 2023-04-30 ENCOUNTER — Encounter: Payer: Self-pay | Admitting: Cardiovascular Disease

## 2023-04-30 NOTE — Progress Notes (Signed)
 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  In 2011, he developed new onset abnormal sensation in his right face, arm and leg and presented to [facility].  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  An MRI showed ventricular size to be normal and there was a 7 to 11 mm benign cyst in the right basal ganglier.  Cerebral tonsils and foramen magnum were within normal limits.  He was evaluated by Dr. Janett Medin and was felt possibly at that time to have suffered a left brainstem or subcortical transient ischemic activity, but ultimately according to Dr. Cipriano Creeks this was subsequently not felt to be the case.  At the time, he was started on Plavix as well as lipid-lowering therapy with Crestor  after his lipid panel showed total cholesterol 245, triglycerides 157, HDL 37, LDL 177.  He was subsequently evaluated by me but I do not have records of my office note from the [facility].  He had undergone an echo Doppler study at that time and also underwent advanced lipid testing with [facility] heart lab on October 31, 2009.  ApoB was 103.  C-reactive protein was intermediary at 2.5 mg/L.  Reportedly LP(a) was normal.  He was subsequently referred to Dr. Raoul Byes Dohmeier and has been on CPAP therapy for obstructive sleep apnea.  In 2014, he was underwent a nuclear stress test in which he completed stage IV the Bruce protocol without ECG changes and had normal myocardial perfusion with EF at 62% and normal wall motion.  In 2019  he underwent a 2D echo Doppler study on July 24, 2017 which showed normal systolic function with EF 55 to 60%.   There was moderate focal basal hypertrophy.  Wall motion was normal.  There was grade 2 diastolic dysfunction.  He was noted to have mild dilatation of ascending aorta at 40 mm.  There was mild left atrial dilatation.  There was no significant valvular pathology.  As part of [facility] he underwent a CT cardiac calcium  score on April 20, 2020 which revealed an elevated calcium  score at 1184 Agaston units: LAD calcification was 367, left circumflex 125, and RCA 693  units.  He has been followed by Dr. Bernetta Brilliant at [facility] for his primary care.  Most recently, he has been on rosuvastatin  20 mg and Zetia 10 mg for hyperlipidemia, vitamin D 2000 units daily, AndroGel  transdermally, 81 mg aspirin, clopidogrel 75 mg, olmesartan 20 mg, and has a prescription for sildenafil to take as needed.  He saw Dr. Bernetta Brilliant and had noticed in April/May 2023 while playing pickle ball with increased heart rate he developed some trouble catching his breath.  This occurred when he was playing on a very hot afternoon.  Since his retirement 2 years ago, he exercises regularly and typically may walk 7-1/2 miles per day.  In addition he has been doing Pilates 2 times per week.  He has noticed occasional shortness of breath while doing Pilates but denies any associated chest pain.  Laboratory in May 2022 on rosuvastatin  10 mg total cholesterol was 151, triglycerides  70, HDL 51 and LDL 84.  Rosuvastatin  dose was increased to 20 mg and subsequent laboratory in September 2022 showed total cholesterol 124, triglycerides 84, HDL 46 and LDL 61.  Most recent laboratory in May 2023 showed total cholesterol 124, triglycerides 79, HDL 36 and LDL 72.  With his recent awareness of some exertional shortness of breath while playing pickle ball and Pilates he presented to reestablish cardiology care after I had not seen him in the office in many years.  When I saw him for my evaluation on October 14, 2021, he was feeling well and denied any  chest pain.  At times he was walking up to 10 miles per day with several long walks without symptoms.  Follow-up lipid studies in May 2023 by Dr. Bernetta Brilliant showed a total cholesterol 124, triglycerides 79, LDL 72 and HDL 36 on Zetia 10 mg and rosuvastatin  20 mg.  I suggested more aggressive treatment and recommended he increase rosuvastatin  to 40 mg and attempt to reduce LDL cholesterol less than 55 or better.  I recommended he undergo an echo Doppler as well as coronary CTA evaluation.  His echo Doppler study on November 15, 2021 continue to show normal LV function with EF 60 to 65% with normal wall motion.  There was grade 2 diastolic dysfunction.  He had normal pulmonary pressures.  RV size and function was normal.  There was very mild LA dilation and very mild dilation of aortic root at 39 mm and ascending aorta at 38 mm.  Coronary CTA on November 18, 2021  revealed a calcium  score at 1524 Agatston units.  Left main was normal.  There was mixed plaque in the proximal LAD with moderate stenosis of 50 to 69%.  The circumflex vessel had 1 to 24% stenosis proximally and 25 to 49% in the AV groove circumflex.  The RCA had extensive mixed plaque and there was concern for possible severe stenosis in the distal RCA with FFR 0.73 in the proximal PDA and 0.72 in the proximal PLV.  Following his noninvasive studies, I contacted the patient.  He was planning a 2-week trip to [place].  At that time he continued to be asymptomatic.  In light of his CTA findings, I called in a prescription for as needed sublingual nitroglycerin .   He underwent follow-up laboratory upon his return from his 2-week trip and total cholesterol is now 103, triglycerides 72, HDL 40, and LDL cholesterol 48 on Zetia 10 mg and rosuvastatin  40 mg.  LP(a) was normal at 49.  I saw him in follow-up on January 29, 2022 and he remained, he asymptomatic with his extensive walking of at least 6-8 times up to 14 miles per day.  On his [place] trip he  was walking on a treadmill without difficulty.  Only rarely has he noticed some mild shortness of breath with HIT Pilates. He and his wife presents to the office for follow-up evaluation. During that evaluation, I had an extensive discussion with both he and his wife.  I discussed the hemodynamic significance of his distal RCA calcified stenosis.  I discussed potential definitive evaluation with cardiac catheterization with possible need for atherectomy or intravascular lithotripsy if significant calcification was present and the risk/benefits of the procedure were discussed in detail.  At the time, he continued to be asymptomatic and was not on any medical therapy.  We discussed continued close surveillance.  I elected to initiate low-dose beta-blocker therapy as both initial antianginal benefit as well as  continued optimal blood pressure and heart rate control.  After much discussion he was felt to pursue the initial medical regimen.  I saw him on April 14, 2022 at which time he continued to be stable.   He does note some slight fatigue since initiating rosuvastatin  but denies any associated symptoms.  He continues to walk at least 6 miles per day and denies any exertional symptoms of chest pain or shortness of breath.  He recently underwent laboratory at [facility] but has not yet received the results.  Both his daughters are attorneys and 1 recently had her first baby locally.  His other daughter who lives in [place]  and will be having a baby in June for which he and his wife plan to be up there and assist for approximately a month.  During that evaluation, I had an extensive discussion with both he and his wife.  He was remaining completely asymptomatic.  I had extensive discussion concerning potential definitive diagnostic cardiac catheterization with potential need for atherectomy or shockwave lithotripsy.  Since he was not on any medical therapy after much discussion I recommended that he start low-dose  beta-blocker therapy for potential anti-ischemic benefit and for optimal blood pressure and heart rate control with continued aggressive lipid management.  Close medical follow-up was advised with continued medical therapy approach and if there was any change in symptomatology definitive cardiac catheterization was recommended.  I saw follow-up 15-24 prior 7 months continue entirely asymptomatic  He tells me he has had several blood tests since I saw him.  Most recent lipid studies on November 10, 2022 showed total cholesterol 115, HDL 44, LDL 43, and triglycerides 295.  He is walking at least 3 times per week for 4 to 5 miles at a time and continues to do Pilates 2 times per week with jumping periods during Pilates and remains entirely asymptomatic.  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  He continues to use CPAP with his ResMed AirSense 11 auto unit followed by Dr. Albertina Hugger.  During his evaluation, pressure was low normal but he was not orthostatic.  He continued to be on losartan 20 mg daily and metoprolol  succinate 12.5 mg.  He remained completely asymptomatic and has been on DAPT with aspirin/Plavix and was on Zetia 10 mg in addition to rosuvastatin  40 mg for aggressive lowering.  Since I last saw him, Alonte continues to feel well.  He received a new ResMed AirSense 11 AutoSet CPAP device earlier this year which is followed by Dr. Albertina Hugger.  He was recently started on metformin for elevation of hemoglobin A1c and following initiation of therapy had decreased to 6.4.  He was recently started on Ozempic on March 5 and has received 7 doses.  This has contributed to 20 pound weight loss.  He continues to walk approximately 4-1/2 miles on most days and denies any significant shortness of breath or chest pressure development.  Recent levothyroxine dose was reduced to 125 mcg.  He is followed by Dr. Aron Lard for his cutaneous marginal zone lymphoma.  He presents for  evaluation.    Past Medical History:  Diagnosis Date   Hypercholesteremia    Hypertension    OSA on CPAP 05/05/2014   Thyroid  disease    TIA (transient ischemic attack)     Past Surgical History:  Procedure Laterality Date   adnoidectomy     VASECTOMY      Current Medications: Outpatient Medications Prior to Visit  Medication Sig Dispense Refill   aspirin 81 MG tablet Take 81 mg by mouth every morning.      Cholecalciferol (VITAMIN D) 2000 UNITS tablet Take 2,000 Units by mouth every morning.      clopidogrel (PLAVIX) 75 MG tablet Take 75 mg by mouth every morning.      ezetimibe (ZETIA) 10 MG tablet Take 10 mg by mouth every morning.      folic acid (FOLVITE) 800 MCG tablet Take 800 mcg by mouth every morning.      levothyroxine (SYNTHROID) 125 MCG tablet Take 125 mcg by mouth daily before breakfast.     metFORMIN (GLUCOPHAGE) 500 MG tablet Take 500 mg by mouth 2 (two) times daily with a meal.     olmesartan (BENICAR) 20 MG tablet Take 20 mg by mouth every morning.      OZEMPIC, 1 MG/DOSE, 4 MG/3ML SOPN Inject 1 mg into the skin once a week.     pantoprazole (PROTONIX) 40 MG tablet Take 40 mg by mouth daily.     sildenafil (VIAGRA) 100 MG tablet TAKE 1 TABLET EVERY DAY AS NEEDED Oral     Testosterone  (ANDROGEL ) 20.25 MG/1.25GM (1.62%) GEL Apply 2 pumps daily as directed Transdermal     zaleplon (SONATA) 10 MG capsule Take 10 mg by mouth at bedtime as needed.     metoprolol  succinate (TOPROL -XL) 25 MG 24 hr tablet TAKE 1 TABLET (25 MG TOTAL) BY MOUTH DAILY. 90 tablet 3   rosuvastatin  (CRESTOR ) 40 MG tablet TAKE 1 TABLET BY MOUTH EVERY DAY IN THE MORNING 90 tablet 3   nitroGLYCERIN  (NITROSTAT ) 0.4 MG SL tablet Place 1 tablet (0.4 mg total) under the tongue every 5 (five) minutes as needed. (Patient not taking: Reported on 01/26/2023) 25 tablet 3   No facility-administered medications prior to visit.     Allergies:   Hydrochlorothiazide   Social History   Socioeconomic  History   Marital status: Married    Spouse name: Not on file   Number of children: Not on file   Years of education: MD   Highest education level: Not on file  Occupational History   Occupation: neurosurgeon  Tobacco Use   Smoking status: Never   Smokeless tobacco: Not on file  Substance and Sexual Activity   Alcohol  use: No    Alcohol /week: 0.0 standard drinks of alcohol     Comment: occasional glass of wine 2x weekly   Drug use: Never   Sexual activity: Yes  Other Topics Concern   Not on file  Social History Narrative   Drinks average of 1 cup coffee daily.   Social Drivers of Corporate investment banker Strain: Low Risk  (10/24/2022)   Overall Financial Resource Strain (CARDIA)    Difficulty of Paying Living Expenses: Not hard at all  Food Insecurity: No Food Insecurity (10/24/2022)   Hunger Vital Sign    Worried About Running Out of Food in the Last Year: Never true    Ran Out of Food in the Last Year: Never true  Transportation Needs: No Transportation Needs (10/24/2022)   PRAPARE - Administrator, Civil Service (Medical): No    Lack of Transportation (Non-Medical): No  Physical Activity: Sufficiently Active (10/24/2022)   Exercise Vital Sign    Days of Exercise per Week: 5 days    Minutes of Exercise per Session: 60 min  Stress: No Stress Concern Present (10/24/2022)   [employer] of Occupational Health - Occupational Stress Questionnaire  Feeling of Stress : Only a little  Social Connections: Socially Integrated (10/24/2022)   Social Connection and Isolation Panel [NHANES]    Frequency of Communication with Friends and Family: More than three times a week    Frequency of Social Gatherings with Friends and Family: More than three times a week    Attends Religious Services: More than 4 times per year    Active Member of [employer] or Organizations: Yes    Attends Banker Meetings: More than 4 times per year    Marital Status: Married     Socially he attended [employer] and was in the 6-year college/medical school program.  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  He exercises regularly and walks at least 7 miles per day.  He is married to Gil La and both his daughters are Engineer, manufacturing systems.  There is no tobacco use.  Family History:  The patient's family history includes Atrial fibrillation in his mother; Cerebral palsy in his brother; Non-Hodgkin's lymphoma in his father; Ovarian cancer in his paternal aunt; Pancreatic cancer in his cousin; Pulmonary embolism in his brother; Stroke in his mother.   ROS General: Negative; No fevers, chills, or night sweats;  HEENT: Negative; No changes in vision or hearing, sinus congestion, difficulty swallowing Pulmonary: Negative; No cough, wheezing, shortness of breath, hemoptysis Cardiovascular: Negative; No chest pain, presyncope, syncope, palpitations GI: Negative; No nausea, vomiting, diarrhea, or abdominal pain GU: Negative; No dysuria, hematuria, or difficulty voiding Musculoskeletal: Negative; no myalgias, joint pain, or weakness Hematologic/Oncology: Recently diagnosed primary cutaneous marginal zone lymphoma followed by Dr. Sonda Dural Endocrine: Positive for hypothyroidism currently on levothyroxine 137 mcg 6 days a week and one half a pill on Sundays Neuro: Negative; no changes in balance, headaches Skin: Negative; No rashes or skin lesions Psychiatric: Negative; No behavioral problems, depression Sleep: On CPAP followed by Dr. Raoul Byes Dohmeier since 2012.  New machine 2025. He is unaware of breakthrough snoring.  No daytime sleepiness, hypersomnolence, bruxism, restless legs, hypnogognic hallucinations, no cataplexy Other comprehensive 14 point system review is negative.   PHYSICAL EXAM:   VS:  BP 108/74 (BP Location: Left Arm, Patient Position: Sitting)   Pulse 75   Ht 5\' 8"  (1.727 m)   Wt 200 lb 6.4 oz (90.9 kg)   SpO2 99%   BMI 30.47 kg/m      Repeat blood pressure by me 100/64  Wt Readings from Last 3 Encounters:  04/27/23 200 lb 6.4 oz (90.9 kg)  01/26/23 229 lb 12.8 oz (104.2 kg)  12/24/22 227 lb (103 kg)   General: Alert, oriented, no distress.  Skin: normal turgor, no rashes, warm and dry HEENT: Normocephalic, atraumatic. Pupils equal round and reactive to light; sclera anicteric; extraocular muscles intact; Fundi ** Nose without nasal septal hypertrophy Mouth/Parynx benign; Mallinpatti scale 3 Neck: No JVD, no carotid bruits; normal carotid upstroke Lungs: clear to ausculatation and percussion; no wheezing or rales Chest wall: without tenderness to palpitation Heart: PMI not displaced, RRR, s1 s2 normal, 1/6 systolic murmur, no diastolic murmur, no rubs, gallops, thrills, or heaves Abdomen: soft, nontender; no hepatosplenomehaly, BS+; abdominal aorta nontender and not dilated by palpation. Back: no CVA tenderness Pulses 2+ Musculoskeletal: full range of motion, normal strength, no joint deformities Extremities: no clubbing cyanosis or edema, Homan's sign negative  Neurologic: grossly nonfocal; Cranial nerves grossly wnl Psychologic: Normal mood and affect     Studies/Labs Reviewed:   EKG Interpretation Date/Time:  Monday April 27 2023 10:26:35  EDT Ventricular Rate:  73 PR Interval:  180 QRS Duration:  102 QT Interval:  396 QTC Calculation: 436 R Axis:   -57  Text Interpretation: Normal sinus rhythm Left anterior fascicular block Minimal voltage criteria for LVH, may be normal variant ( R in aVL ) When compared with ECG of 27-Apr-2023 10:26, No significant change was found Confirmed by Magnus Schuller ([id number]) on 04/30/2023 2:39:06 PM    November 21, 2022 ECG (independently read by me): Bradycardia at 55, left anterior hemiblock.  voltage for LVH.  April 14, 2022 ECG (independently read by me): NSR at 61 bpm, mild sinus arrythmia, normal intervals  January 29, 2022 ECG (independently read by me): Normal sinus  rhythm at 64 bpm but very mild sinus arrhythmia.  Left anterior hemiblock, unchanged.  Borderline LVH.  Early transition.  October 14, 2021 ECG (independently read by me): NSR at 61, LAHB, LVH  Recent Labs:    Latest Ref Rng & Units 10/24/2022   11:03 AM 01/14/2022    9:50 AM 11/13/2021    2:32 PM  BMP  Glucose 70 - 99 mg/dL 604  540  981   BUN 8 - 23 mg/dL 18  15  19    Creatinine 0.61 - 1.24 mg/dL 1.91  4.78  2.95   BUN/Creat Ratio 10 - 24  12  17    Sodium 135 - 145 mmol/L 135  140  138   Potassium 3.5 - 5.1 mmol/L 4.3  4.7  4.6   Chloride 98 - 111 mmol/L 101  102  102   CO2 22 - 32 mmol/L 28  24  22    Calcium  8.9 - 10.3 mg/dL 62.1  9.7  9.6         Latest Ref Rng & Units 10/24/2022   11:03 AM 01/14/2022    9:50 AM 10/23/2009    7:00 AM  Hepatic Function  Total Protein 6.5 - 8.1 g/dL 7.5  7.0  7.6   Albumin 3.5 - 5.0 g/dL 4.7  4.6  4.2   AST 15 - 41 U/L 31  26  24    ALT 0 - 44 U/L 34  35  23   Alk Phosphatase 38 - 126 U/L 46  52  41   Total Bilirubin 0.3 - 1.2 mg/dL 0.9  0.7  1.2        Latest Ref Rng & Units 10/24/2022   11:03 AM 10/11/2012    5:22 AM 10/11/2012    4:54 AM  CBC  WBC 4.0 - 10.5 K/uL 5.5   5.1   Hemoglobin 13.0 - 17.0 g/dL 30.8  65.7  84.6   Hematocrit 39.0 - 52.0 % 46.8  47.0  44.7   Platelets 150 - 400 K/uL 163   156    Lab Results  Component Value Date   MCV 82.8 10/24/2022   MCV 84.2 10/11/2012   MCV 84.4 10/23/2009   No results found for: "TSH" Lab Results  Component Value Date   HGBA1C (H) 10/23/2009    5.9 (NOTE)                                                                       According to the ADA Clinical Practice Recommendations for 2011,  when HbA1c is used as a screening test:   >=6.5%   Diagnostic of Diabetes Mellitus           (if abnormal result  is confirmed)  5.7-6.4%   Increased risk of developing Diabetes Mellitus  References:Diagnosis and Classification of Diabetes Mellitus,Diabetes Care,2011,34(Suppl 1):S62-S69 and Standards  of Medical Care in         Diabetes - 2011,Diabetes Care,2011,34  (Suppl 1):S11-S61.     BNP No results found for: "BNP"  ProBNP No results found for: "PROBNP"   Lipid Panel     Component Value Date/Time   CHOL 103 01/14/2022 0950   TRIG 72 01/14/2022 0950   HDL 40 01/14/2022 0950   CHOLHDL 2.6 01/14/2022 0950   CHOLHDL 6.6 10/23/2009 0823   VLDL 31 10/23/2009 0823   LDLCALC 48 01/14/2022 0950   LABVLDL 15 01/14/2022 0950     RADIOLOGY: No results found.   Additional studies/ records that were reviewed today include:  I have reviewed the patient's prior discharge summary from his October 23, 2009 hospitalization.  Records of Dr. Bernetta Brilliant at [facility] were reviewed as well as his prior nuclear medicine study, echocardiographic evaluation and CT cardiac calcium  score.  ASSESSMENT:    1. CAD in native artery   2. Essential hypertension   3. OSA on CPAP   4. Elevated coronary artery calcium  score: 1524 Agatston units   5. Grade II diastolic dysfunction   6. Mild obesity   7. Hyperlipidemia with target LDL less than 55.   8. Acquired hypothyroidism   9. Aortic dilatation (HCC)   10. 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  He was felt possibly to have had a TIA but definitive diagnosis was not established.  At the time he had significant hyperlipidemia with LDL cholesterol at 177.  He also was found to have obstructive sleep apnea and has been on CPAP therapy since 2012.  He denies any significant chest pain.  He underwent a nuclear perfusion study in October 2014 which was normal.  He completed stage IV the Bruce protocol and reached a heart rate of 151 and peak blood pressure 189/74.  In 2019, subsequent echo Doppler study showed normal systolic function and there was evidence for moderate focal basal  hypertrophy.  There was grade 2 diastolic dysfunction and there was mild dilatation of his ascending aorta at 40 mm.  Atrial septum was increased in thickness consistent with lipomatous hypertrophy.  He  remained asymptomatic and has been treated with lipid-lowering therapy since his transient neurologic event in 2011.  He underwent CT cardiac scoring per [facility] in 2021 which disclosed significant elevation of his calcium  score at 1184 Agatston units with calcification predominantly in the RCA and LAD and mildly in the left circumflex.  Since his retirement in May 2021 he has significantly increased his level of activity and has been successful with weight loss.  He typically walks at least 2 times per day often for 7 to 10 miles without difficulty.  However recently he had noticed with more rapid movement such as playing pickle ball on a very hot day or doing certain Pilates moves he has experienced some increased shortness of breath.  I reviewed office records from Dr. Bernetta Brilliant.  When I saw him in October 2023 with his LDL cholesterol of 72  on Zetia 10 mg and rosuvastatin  20 mg I recommended more aggressive therapy and titrated rosuvastatin  to 40 mg daily with target LDL less than 55.  Coronary CTA on November 18, 2021 revealed calcium  score 1524.  The left main was normal.  There was mixed plaque in the proximal LAD with moderate stenosis of 50 to 69%.  Circumflex vessel had 1 to 24% proximal stenosis with 25 to 49% in the AV groove.  The RCA had extensive mixed plaque and there was concern for possible significant stenosis in the distal RCA with positive FFR.  Subsequent laboratory on January 14, 2022 has now showed LDL cholesterol at 48, with total cholesterol 103, triglycerides 72 and HDL 40.  He continues to have normal LV systolic function noted on echocardiography in November 2023 and had grade 2 diastolic dysfunction.  At his evaluation in January 2024 I had an extensive discussion with both he  and his wife concerning his coronary CTA findings.  At that time he was not on medical therapy and continued to be completely asymptomatic.  I discussed definitive cardiac catheterization and if significant calcification is present potential need for atherectomy or shockwave lithotripsy.  I empirically added low-dose beta-blocker therapy.  When seen in follow-up in November 2024 blood pressure was stable but on the low side and he was not orthostatic.  His blood pressure today is on the low side but not orthostatic.  He is on olmesartan 20 mg daily.   He remained completely asymptomatic despite his level of activity.  He is on DAPT with aspirin/Plavix and continues to be on Zetia 10 mg in addition to rosuvastatin  40 mg for aggressive lipid-lowering therapy.  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 Sonda Dural. Blood pressure today remains on the low side but he feels well and denies any dizziness or lightheadedness.  His hemoglobin A1c had recently increased above 6.5 and he was started on metformin by Dr. Bernetta Brilliant.  Level has improved to 6.4 since initiating therapy.  He also recently initiated therapy with Ozempic and has received 7 treatments.  He has lost approximately 20 pounds.  Silly, levothyroxine was reduced from 137 down to 125 mcg.  He continues to be aggressively treated with Zetia 10 mg and rosuvastatin  40 mg.  Dr. Bernetta Brilliant will be rechecking LDL cholesterol which has consistently been very stable on treatment.  He received a new AirSense 11 CPAP machine and is doing well with this, followed by Dr. Albertina Hugger.  He is aware of my upcoming retirement.  He continues to be active.  I will transition him to the care of Dr. Alvis Ba following my retirement with plans for initial visit In approximately 6 months or sooner if there is any change in symptomatology.   Medication Adjustments/Labs and Tests Ordered: Current medicines  are reviewed at length with the patient today.  Concerns regarding medicines are outlined above.  Medication changes, Labs and Tests ordered today are listed in the Patient Instructions below. Patient Instructions  Medication Instructions:  NO CHANGES *If you need a refill on your cardiac medications before your next appointment, please call your pharmacy*  Lab Work: NO LABS If you have labs (blood work) drawn today and your tests are completely normal, you will receive your results only by: MyChart Message (if you have MyChart) OR A paper copy in the mail If you have any lab test that is abnormal or we need to  change your treatment, we will call you to review the results.  Testing/Procedures: NO TESTING  Follow-Up: At [facility], you and your health needs are our priority.  As part of our continuing mission to provide you with exceptional heart care, our providers are all part of one team.  This team includes your primary Cardiologist (physician) and Advanced Practice Providers or APPs (Physician Assistants and Nurse Practitioners) who all work together to provide you with the care you need, when you need it.  Your next appointment:   6 month(s)  Provider:   Luana Rumple, MD    Other Instructions   1st Floor: - Lobby - Registration  - Pharmacy  - Lab - Cafe  2nd Floor: - PV Lab - Diagnostic Testing (echo, CT, nuclear med)  3rd Floor: - Vacant  4th Floor: - TCTS (cardiothoracic surgery) - AFib Clinic - Structural Heart Clinic - Vascular Surgery  - Vascular Ultrasound  5th Floor: - HeartCare Cardiology (general and EP) - Clinical Pharmacy for coumadin, hypertension, lipid, weight-loss medications, and med management appointments    Valet parking services will be available as well.      Signed, Magnus Schuller, MD  04/30/2023 2:47 PM    [facility] [address], [address], [place], [place]  [address] Phone: [phone]

## 2023-07-24 ENCOUNTER — Encounter: Payer: Self-pay | Admitting: Advanced Practice Midwife

## 2023-07-27 ENCOUNTER — Inpatient Hospital Stay (HOSPITAL_BASED_OUTPATIENT_CLINIC_OR_DEPARTMENT_OTHER): Payer: Medicare Other | Admitting: Oncology

## 2023-07-27 ENCOUNTER — Inpatient Hospital Stay: Payer: Medicare Other | Attending: Oncology

## 2023-07-27 VITALS — BP 124/80 | HR 64 | Temp 97.7°F | Resp 18 | Ht 68.0 in | Wt 186.6 lb

## 2023-07-27 DIAGNOSIS — C8519 Unspecified B-cell lymphoma, extranodal and solid organ sites: Secondary | ICD-10-CM

## 2023-07-27 DIAGNOSIS — I2510 Atherosclerotic heart disease of native coronary artery without angina pectoris: Secondary | ICD-10-CM | POA: Diagnosis not present

## 2023-07-27 DIAGNOSIS — E039 Hypothyroidism, unspecified: Secondary | ICD-10-CM | POA: Diagnosis not present

## 2023-07-27 DIAGNOSIS — Z807 Family history of other malignant neoplasms of lymphoid, hematopoietic and related tissues: Secondary | ICD-10-CM | POA: Insufficient documentation

## 2023-07-27 DIAGNOSIS — C884 Extranodal marginal zone b-cell lymphoma of mucosa-associated lymphoid tissue (malt-lymphoma) not having achieved remission: Secondary | ICD-10-CM | POA: Diagnosis present

## 2023-07-27 DIAGNOSIS — K219 Gastro-esophageal reflux disease without esophagitis: Secondary | ICD-10-CM | POA: Insufficient documentation

## 2023-07-27 DIAGNOSIS — G4730 Sleep apnea, unspecified: Secondary | ICD-10-CM | POA: Diagnosis not present

## 2023-07-27 DIAGNOSIS — Z8673 Personal history of transient ischemic attack (TIA), and cerebral infarction without residual deficits: Secondary | ICD-10-CM | POA: Insufficient documentation

## 2023-07-27 DIAGNOSIS — I10 Essential (primary) hypertension: Secondary | ICD-10-CM | POA: Diagnosis not present

## 2023-07-27 DIAGNOSIS — E291 Testicular hypofunction: Secondary | ICD-10-CM | POA: Diagnosis not present

## 2023-07-27 DIAGNOSIS — Z8041 Family history of malignant neoplasm of ovary: Secondary | ICD-10-CM | POA: Diagnosis not present

## 2023-07-27 LAB — CBC WITH DIFFERENTIAL (CANCER CENTER ONLY)
Abs Immature Granulocytes: 0.01 K/uL (ref 0.00–0.07)
Basophils Absolute: 0 K/uL (ref 0.0–0.1)
Basophils Relative: 1 %
Eosinophils Absolute: 0.2 K/uL (ref 0.0–0.5)
Eosinophils Relative: 3 %
HCT: 41.7 % (ref 39.0–52.0)
Hemoglobin: 14.3 g/dL (ref 13.0–17.0)
Immature Granulocytes: 0 %
Lymphocytes Relative: 23 %
Lymphs Abs: 1.1 K/uL (ref 0.7–4.0)
MCH: 29.2 pg (ref 26.0–34.0)
MCHC: 34.3 g/dL (ref 30.0–36.0)
MCV: 85.1 fL (ref 80.0–100.0)
Monocytes Absolute: 0.4 K/uL (ref 0.1–1.0)
Monocytes Relative: 8 %
Neutro Abs: 3.2 K/uL (ref 1.7–7.7)
Neutrophils Relative %: 65 %
Platelet Count: 201 K/uL (ref 150–400)
RBC: 4.9 MIL/uL (ref 4.22–5.81)
RDW: 12.6 % (ref 11.5–15.5)
WBC Count: 4.9 K/uL (ref 4.0–10.5)
nRBC: 0 % (ref 0.0–0.2)

## 2023-07-27 LAB — CMP (CANCER CENTER ONLY)
ALT: 40 U/L (ref 0–44)
AST: 28 U/L (ref 15–41)
Albumin: 4.3 g/dL (ref 3.5–5.0)
Alkaline Phosphatase: 48 U/L (ref 38–126)
Anion gap: 10 (ref 5–15)
BUN: 15 mg/dL (ref 8–23)
CO2: 24 mmol/L (ref 22–32)
Calcium: 9.7 mg/dL (ref 8.9–10.3)
Chloride: 103 mmol/L (ref 98–111)
Creatinine: 1.16 mg/dL (ref 0.61–1.24)
GFR, Estimated: >60 mL/min (ref >60)
Glucose, Bld: 120 mg/dL — ABNORMAL HIGH (ref 70–99)
Potassium: 4.7 mmol/L (ref 3.5–5.1)
Sodium: 136 mmol/L (ref 135–145)
Total Bilirubin: 0.9 mg/dL (ref 0.0–1.2)
Total Protein: 7.1 g/dL (ref 6.5–8.1)

## 2023-07-27 LAB — LACTATE DEHYDROGENASE: LDH: 153 U/L (ref 98–192)

## 2023-07-27 NOTE — Progress Notes (Signed)
 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  Darren Hull feels well.  Darren Hull reports intentional weight loss since starting Ozempic.  Darren Hull developed GI symptoms including nausea, constipation, and diarrhea when Darren Hull started Ozempic.  The dose has been adjusted.  No fever or night sweats.  No new skin lesions.  The lesion at the left arm appears more prominent.  Darren Hull relates this to loss of subcutaneous fat. Darren Hull underwent negative genetic testing earlier this year.  Objective:  Vital signs in last 24 hours:  Blood pressure 124/80, pulse 64, temperature 97.7 F (36.5 C), temperature source Oral, resp. rate 18, height 5' 8 (1.727 m), weight 186 lb 9.6 oz (84.6 kg), SpO2 99%.    Lymphatics: No cervical, supraclavicular, axillary, or inguinal nodes Resp: Lungs clear bilaterally Cardio: Regular rate and rhythm, S4 gallop? GI: No hepatosplenomegaly Vascular: No leg edema  Skin: Slightly raised pink 6-7 mm lesion at the left upper back (barely perceptible), 1-1.5 cm raised nodular lesion at the left upper arm.  No other suspicious skin lesions  Lab Results:  Lab Results  Component Value Date   WBC 4.9 07/27/2023   HGB 14.3 07/27/2023   HCT 41.7 07/27/2023   MCV 85.1 07/27/2023   PLT 201 07/27/2023   NEUTROABS 3.2 07/27/2023    CMP  Lab Results  Component Value Date   NA 135 10/24/2022   K 4.3 10/24/2022   CL 101 10/24/2022   CO2 28 10/24/2022   GLUCOSE 246 (H) 10/24/2022   BUN 18 10/24/2022   CREATININE 1.29 (H) 10/24/2022   CALCIUM  10.2 10/24/2022   PROT 7.5 10/24/2022   ALBUMIN 4.7 10/24/2022   AST 31 10/24/2022   ALT 34 10/24/2022   ALKPHOS 46 10/24/2022   BILITOT 0.9 10/24/2022   GFRNONAA >60 10/24/2022   GFRAA  10/23/2009    >60        The eGFR has been calculated using the MDRD equation. This calculation has not been validated in all clinical situations. eGFR's  persistently <60 mL/min signify possible Chronic Kidney Disease.     Medications: I have reviewed the patient's current medications.   Assessment/Plan:  Atypical lymphoid infiltrate suspicious for a B-cell lymphoproliferative disorder involving biopsies of  left upper arm and mid upper back skin lesions Left lateral shoulder biopsy 09/17/2022-B-cell component in both nodular and reticular areas,, germinal center areas display positive for CD10, BCL6, and Bcl-2, CD30 positive in larger lymphoid cells, CD138 highlights a minor plasma cell component with lambda light chain excess, no coexpression of CD5 in B-cell areas, B-cell gene rearrangement-clonal immunoglobulin kappa light chain gene rearrangement Midline superior upper back biopsy 10/02/2022-mixture of T and B cells with slight predominance of T cells, BCL6 highlights germinal centers, Bcl-2 negative.  No coexpression of CD5 in B-cell areas.  No CD10 or cyclin D1 positivity.  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  There is no clinical evidence of disease progression.  The lesion at the left upper back appears smaller.  The left arm lesion  appears more prominent, but does not measure larger.  Darren Hull is comfortable with continuing observation.  Darren Hull will return for an office visit in 9 months.  Darren Hull will see Dr. Joshua in the interim.  Darren Hull will call for new/enlarging skin lesions or other symptoms of lymphoma Darren Hull continues follow-up  with cardiology.SABRA Arley Hof, MD  07/27/2023  10:21 AM

## 2023-11-09 ENCOUNTER — Encounter: Payer: Self-pay | Admitting: Cardiovascular Disease

## 2023-11-09 ENCOUNTER — Ambulatory Visit: Attending: Cardiovascular Disease | Admitting: Cardiovascular Disease

## 2023-11-09 VITALS — BP 118/72 | HR 61 | Ht 68.0 in | Wt 182.0 lb

## 2023-11-09 DIAGNOSIS — Z8639 Personal history of other endocrine, nutritional and metabolic disease: Secondary | ICD-10-CM | POA: Insufficient documentation

## 2023-11-09 DIAGNOSIS — G4733 Obstructive sleep apnea (adult) (pediatric): Secondary | ICD-10-CM | POA: Insufficient documentation

## 2023-11-09 DIAGNOSIS — N521 Erectile dysfunction due to diseases classified elsewhere: Secondary | ICD-10-CM | POA: Diagnosis present

## 2023-11-09 DIAGNOSIS — I2510 Atherosclerotic heart disease of native coronary artery without angina pectoris: Secondary | ICD-10-CM | POA: Diagnosis not present

## 2023-11-09 DIAGNOSIS — I77810 Thoracic aortic ectasia: Secondary | ICD-10-CM | POA: Diagnosis present

## 2023-11-09 DIAGNOSIS — E785 Hyperlipidemia, unspecified: Secondary | ICD-10-CM | POA: Insufficient documentation

## 2023-11-09 DIAGNOSIS — I10 Essential (primary) hypertension: Secondary | ICD-10-CM | POA: Insufficient documentation

## 2023-11-09 NOTE — Patient Instructions (Signed)

## 2023-11-09 NOTE — Progress Notes (Signed)
 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 Cloretta), treated hypothyroidism.  He feels great.  He is physically active.  He is able to do the 8 mile hike around the [facility] [place] and all the way up to the [employer] in [place] without any symptoms or shortness of breath or chest pain.  He does the loop [place] walking at a 17.5 minute per mile pace.  He does this 2 or 3 times a week and then also does Pilates for an hour twice a week.  He also denies palpitations, dizziness, syncope, focal neurological complaints or intermittent claudication.  He has not had any problems with bleeding or severe bruising on clopidogrel plus aspirin dual therapy.  He feels that he is in much better shape after losing weight on Ozempic (he also takes metformin).  He has lost 45 pounds in the last year.  His metabolic parameters have also improved.  Hemoglobin A1c peaked at 6.9% and is now down to 5.1%.  His LDL cholesterol is 25 and his triglycerides are normal.  He is on a combination of rosuvastatin  and ezetimibe.  TSH was mildly suppressed on labs from July 2025 at 0.104.  His dose of levothyroxine has been adjusted downward.   His blood pressure is consistently around 120/70 on olmesartan and metoprolol  succinate, both in very low doses.  His mother had a history of coronary stents.  Studies Reviewed EKG Interpretation Date/Time:  Monday November 09 2023 09:04:03 EST Ventricular Rate:  61 PR Interval:  178 QRS Duration:  96 QT Interval:  408 QTC Calculation: 410 R Axis:   -55  Text Interpretation: Normal sinus rhythm with sinus arrhythmia Left anterior fascicular block When compared with ECG of 27-Apr-2023 10:26, No significant change was found Confirmed by Darren Hull [phone]) on 11/09/2023 9:06:43 AM    Reviewed CT angiogram from November 2023 and coronary calcium  score 2022, echocardiogram November 2023, nuclear stress test 2014  07/28/2023 Cholesterol 77, HDL 37, LDL 25, triglycerides 77 Hemoglobin A1c 5.1% Hemoglobin 14.3, breading 1.16, potassium 4.7, ALT 40, TSH 0.104  Risk Assessment/Calculations        Physical Exam VS:  BP 118/72 (BP Location: Left Arm, Patient Position: Sitting, Cuff Size: Normal)   Pulse 61   Ht 5' 8 (1.727 m)   Wt 182 lb (82.6 kg)   SpO2 98%   BMI 27.67 kg/m        Wt Readings from Last 3 Encounters:  11/09/23 182 lb (82.6 kg)  07/27/23 186 lb 9.6 oz (84.6 kg)  04/27/23 200 lb 6.4 oz (90.9 kg)    GEN: Well nourished, well developed in no acute distress NECK: No JVD; No carotid bruits CARDIAC: RRR, no murmurs, rubs, gallops RESPIRATORY:  Clear to auscultation without rales,  wheezing or rhonchi  ABDOMEN: Soft, non-tender, non-distended EXTREMITIES:  No edema; No deformity   ASSESSMENT AND PLAN  CAD: Severely elevated coronary calcium  score with generally moderate stenoses and remains asymptomatic, with only a tiny dose of metoprolol  succinate 12.5 mg daily as an antianginal medication.  The focus remains on treating his risk factors. DM: Cured with weight loss. HTN: Excellent blood pressure control.  With weight loss we might have to retreat on an  hypertensive medications and stop his olmesartan.  Otherwise it would be preferable to stay on this medication due to theoretical benefits in preventing expansion of the mildly dilated ascending aorta HLP: Excellent lipid profile, we could consider stopping the ezetimibe. Asc Ao dilation: Borderline and stable when comparing at the diameter in April 2022 and November 2023 at 4.0 cm OSA: Monitored by Darren Hull ED: He is aware of the serious potential interaction between sublingual nitroglycerin  and any other nitrate product and sildenafil, which he takes occasionally for erectile dysfunction.  He never takes nitroglycerin .       Dispo: Follow-up in 1 year  Signed, Darren Balding, MD

## 2024-01-06 NOTE — Progress Notes (Signed)
 Darren Hull

## 2024-01-11 ENCOUNTER — Ambulatory Visit (INDEPENDENT_AMBULATORY_CARE_PROVIDER_SITE_OTHER): Payer: Medicare Other | Admitting: Neurology

## 2024-01-11 ENCOUNTER — Encounter: Payer: Self-pay | Admitting: Neurology

## 2024-01-11 VITALS — BP 129/71 | HR 78 | Ht 68.0 in | Wt 186.0 lb

## 2024-01-11 DIAGNOSIS — E119 Type 2 diabetes mellitus without complications: Secondary | ICD-10-CM | POA: Insufficient documentation

## 2024-01-11 DIAGNOSIS — G4733 Obstructive sleep apnea (adult) (pediatric): Secondary | ICD-10-CM

## 2024-01-11 DIAGNOSIS — Z7985 Long-term (current) use of injectable non-insulin antidiabetic drugs: Secondary | ICD-10-CM

## 2024-01-11 NOTE — Patient Instructions (Signed)

## 2024-01-11 NOTE — Progress Notes (Signed)
 "        Provider:  Dedra Gores, MD  Primary Care Physician:  [name], MD [address] 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 He is successfully retired, became diabetic last year and had been extremely well controlled, and he lost weight.  45 pounds on ozempic. He is liking the CPAP and rarely needs a nap, he is interested in continuing CPAP and less interested to retest at this time.  Dedicated Air-mini user when travelling.   Expecting a new grandchild in 11 days, number 3.     Chief concern according to patient :  none      Review of Systems: Out of a complete 14 system review, the patient complains of only the following symptoms, and all other reviewed systems are negative.:   SLEEPINESS ?  How likely are you to doze in the following situations: 0 = not likely, 1 = slight chance, 2 = moderate chance, 3 = high chance  Sitting and Reading? Watching Television? Sitting inactive in a public place (theater or meeting)? Lying down in the afternoon when circumstances permit? Sitting and talking to someone? Sitting quietly after lunch without alcohol ? In a car, while stopped for a few minutes in traffic? As a passenger in a car for an hour without a break?  Total = # 3  FSS:        Social History   Socioeconomic History   Marital status: Married    Spouse name: Not on file   Number of children: Not on file   Years of education: MD   Highest education level: Not on file  Occupational History   Occupation: neurosurgeon  Tobacco Use   Smoking status: Never   Smokeless tobacco: Not on file  Substance and Sexual Activity   Alcohol  use: No     Alcohol /week: 0.0 standard drinks of alcohol     Comment: occasional glass of wine 2x weekly   Drug use: Never   Sexual activity: Yes  Other Topics Concern   Not on file  Social History Narrative   Drinks average of 1 cup coffee daily.   Social Drivers of Health   Tobacco Use: Unknown (01/11/2024)   Patient History    Smoking Tobacco Use: Never    Smokeless Tobacco Use: Unknown    Passive Exposure: Not on file  Financial Resource Strain: Low Risk (10/24/2022)   Overall Financial Resource Strain (CARDIA)    Difficulty of Paying Living Expenses: Not hard at all  Food Insecurity: Low Risk (08/28/2023)   Received from [facility]   Epic    Within the past 12 months, you worried that your food would run out before you got money to buy more: Never true    Within the past 12 months, the food you bought just didn't last and you didn't have money to get more. : Never true  Transportation Needs: No Transportation Needs (08/28/2023)   Received from [facility]    In the past 12 months, has lack of reliable transportation kept you  from medical appointments, meetings, work or from getting things needed for daily living? : No  Physical Activity: Sufficiently Active (10/24/2022)   Exercise Vital Sign    Days of Exercise per Week: 5 days    Minutes of Exercise per Session: 60 min  Stress: No Stress Concern Present (10/24/2022)   [employer] of Occupational Health - Occupational Stress Questionnaire    Feeling of Stress : Only a little  Social Connections: Socially Integrated (10/24/2022)   Social Connection and Isolation Panel    Frequency of Communication with Friends and Family: More than three times a week    Frequency of Social Gatherings with Friends and Family: More than three times a week    Attends Religious Services: More than 4 times per year    Active Member of Clubs or Organizations: Yes    Attends Banker Meetings: More than 4 times per year     Marital Status: Married  Depression (PHQ2-9): Low Risk (07/27/2023)   Depression (PHQ2-9)    PHQ-2 Score: 0  Alcohol  Screen: Low Risk (10/24/2022)   Alcohol  Screen    Last Alcohol  Screening Score (AUDIT): 1  Housing: Low Risk (08/28/2023)   Received from [facility]   Epic    What is your living situation today?: I have a steady place to live    Think about the place you live. Do you have problems with any of the following? Choose all that apply:: None/None on this list  Utilities: Low Risk (08/28/2023)   Received from [facility]   Utilities    In the past 12 months has the electric, gas, oil, or water company threatened to shut off services in your home? : No  Health Literacy: Adequate Health Literacy (10/24/2022)   B1300 Health Literacy    Frequency of need for help with medical instructions: Never    Family History  Problem Relation Age of Onset   Stroke Mother    Atrial fibrillation Mother    Non-Hodgkin's lymphoma Father        dx 63   Pulmonary embolism Brother    Cerebral palsy Brother    Ovarian cancer Paternal Aunt        d. 92   Pancreatic cancer Cousin        d. 76; maternal male cousin    Past Medical History:  Diagnosis Date   Hypercholesteremia    Hypertension    OSA on CPAP 05/05/2014   Thyroid  disease    TIA (transient ischemic attack)     Past Surgical History:  Procedure Laterality Date   adnoidectomy     VASECTOMY       Medications Ordered Prior to Encounter[1]  Allergies[2]   DIAGNOSTIC DATA (LABS, IMAGING, TESTING) - I reviewed patient records, labs, notes, testing and imaging myself where available.  Lab Results  Component Value Date   WBC 4.9 07/27/2023   HGB 14.3 07/27/2023   HCT 41.7 07/27/2023   MCV 85.1 07/27/2023   PLT 201 07/27/2023      Component Value Date/Time   NA 136 07/27/2023 0950   NA 140 01/14/2022 0950   K 4.7 07/27/2023 0950   CL 103 07/27/2023 0950   CO2 24 07/27/2023 0950   GLUCOSE 120 (H) 07/27/2023  0950   BUN 15 07/27/2023 0950   BUN 15 01/14/2022 0950   CREATININE 1.16 07/27/2023 0950   CALCIUM  9.7 07/27/2023 0950   PROT 7.1 07/27/2023 0950   PROT 7.0 01/14/2022 0950  ALBUMIN 4.3 07/27/2023 0950   ALBUMIN 4.6 01/14/2022 0950   AST 28 07/27/2023 0950   ALT 40 07/27/2023 0950   ALKPHOS 48 07/27/2023 0950   BILITOT 0.9 07/27/2023 0950   GFRNONAA >60 07/27/2023 0950   GFRAA  10/23/2009 0700    >60        The eGFR has been calculated using the MDRD equation. This calculation has not been validated in all clinical situations. eGFR's persistently <60 mL/min signify possible Chronic Kidney Disease.   Lab Results  Component Value Date   CHOL 103 01/14/2022   HDL 40 01/14/2022   LDLCALC 48 01/14/2022   TRIG 72 01/14/2022   CHOLHDL 2.6 01/14/2022   Lab Results  Component Value Date   HGBA1C (H) 10/23/2009    5.9 (NOTE)                                                                       According to the ADA Clinical Practice Recommendations for 2011, when HbA1c is used as a screening test:   >=6.5%   Diagnostic of Diabetes Mellitus           (if abnormal result  is confirmed)  5.7-6.4%   Increased risk of developing Diabetes Mellitus  References:Diagnosis and Classification of Diabetes Mellitus,Diabetes Care,2011,34(Suppl 1):S62-S69 and Standards of Medical Care in         Diabetes - 2011,Diabetes Care,2011,34  (Suppl 1):S11-S61.   No results found for: VITAMINB12 No results found for: TSH  PHYSICAL EXAM:  Vitals:   01/11/24 1025  BP: 129/71  Pulse: 78   No data found. Body mass index is 28.28 kg/m.   Wt Readings from Last 3 Encounters:  01/11/24 186 lb (84.4 kg)  11/09/23 182 lb (82.6 kg)  07/27/23 186 lb 9.6 oz (84.6 kg)     Ht Readings from Last 3 Encounters:  01/11/24 5' 8 (1.727 m)  11/09/23 5' 8 (1.727 m)  07/27/23 5' 8 (1.727 m)      General: The patient is awake, alert and appears not in acute distress and groomed. Head: Normocephalic,  atraumatic.  Neck is supple.   ASSESSMENT AND PLAN :   69 y.o. year old male  here with:    1) OSA on CPAP  2) some early -waking -up , one time nocturia:    3) interested in continuing CPAP, in spite of weight loss , no retesting needed.   RV in 12 month.     I would like to thank [name], MD and [name], Md [address] [place],  [place] [address] for allowing me to meet with this pleasant patient.   Sleep Clinic Patients are generally offered input on sleep hygiene, life style changes and how to improve compliance with medical treatment where applicable. Review and reiteration of good sleep hygiene measures is offered to any sleep clinic patient, be it in the first consultation or with any follow up visits.    Any patient with sleepiness should be cautioned not to drive, work at heights, or operate dangerous or heavy equipment when feeling tired or sleepy.      The patient will be seen in follow-up in the sleep clinic at [facility] for discussion  of test results, sleep related symptoms and treatment compliance review, further management strategies, etc.   The referring provider will be notified of the test results.   The patient's condition requires frequent monitoring and adjustments in the treatment plan, reflecting the ongoing complexity of care.  This provider is the continuing focal point for all needed services for this condition.  After spending a total time of  15  minutes face to face and time for  history taking, physical and neurologic examination, review of laboratory studies,  personal review of imaging studies, reports and results of other testing and review of referral information / records as far as provided in visit,   Electronically signed by: Dedra Gores, MD 01/11/2024 11:21 AM  [facility] Board certified by The [employer] of Sleep Medicine and Diplomate of the [employer] of Sleep  Medicine. Board certified In Neurology through the ABPN, Fellow of the [employer] of Neurology.       [1]  Current Outpatient Medications on File Prior to Visit  Medication Sig Dispense Refill   aspirin 81 MG tablet Take 81 mg by mouth every morning.      Cholecalciferol (VITAMIN D) 2000 UNITS tablet Take 2,000 Units by mouth every morning.      clopidogrel (PLAVIX) 75 MG tablet Take 75 mg by mouth every morning.      ezetimibe (ZETIA) 10 MG tablet Take 10 mg by mouth every morning.      folic acid (FOLVITE) 800 MCG tablet Take 800 mcg by mouth every morning.      levothyroxine (SYNTHROID) 100 MCG tablet Take 100 mcg by mouth every morning.     metFORMIN (GLUCOPHAGE-XR) 500 MG 24 hr tablet Take 1,000 mg by mouth daily.     metoprolol  succinate (TOPROL -XL) 25 MG 24 hr tablet Take 12.5 mg by mouth daily.     nitroGLYCERIN  (NITROSTAT ) 0.4 MG SL tablet Place 1 tablet (0.4 mg total) under the tongue every 5 (five) minutes as needed. 25 tablet 3   olmesartan (BENICAR) 5 MG tablet Take 5 mg by mouth daily.     OZEMPIC, 1 MG/DOSE, 4 MG/3ML SOPN Inject 1 mg into the skin once a week.     pantoprazole (PROTONIX) 40 MG tablet Take 40 mg by mouth daily. (Patient taking differently: Take 40 mg by mouth at bedtime.)     rosuvastatin  (CRESTOR ) 40 MG tablet Take 1 tablet (40 mg total) by mouth daily. 90 tablet 3   sildenafil (VIAGRA) 100 MG tablet TAKE 1 TABLET EVERY DAY AS NEEDED Oral     Testosterone  (ANDROGEL ) 20.25 MG/1.25GM (1.62%) GEL Apply 2 pumps daily as directed Transdermal     zaleplon (SONATA) 10 MG capsule Take 10 mg by mouth at bedtime as needed.     No current facility-administered medications on file prior to visit.  [2]  Allergies Allergen Reactions   Hydrochlorothiazide Itching, Rash, Anaphylaxis, Dermatitis and Swelling    Severe rash; takes 2 dosepaks of steroids to stop rash.   "

## 2024-04-27 ENCOUNTER — Ambulatory Visit: Admitting: Oncology

## 2024-04-27 ENCOUNTER — Other Ambulatory Visit

## 2025-01-18 ENCOUNTER — Ambulatory Visit: Admitting: Neurology
